# Patient Record
Sex: Female | Born: 1965 | Race: Black or African American | Hispanic: No | Marital: Married | Smoking: Never smoker
Health system: Southern US, Community
[De-identification: ages and names within clinical notes are randomized; demographics above are authoritative.]

## PROBLEM LIST (undated history)

## (undated) DIAGNOSIS — I10 Essential (primary) hypertension: Secondary | ICD-10-CM

## (undated) HISTORY — DX: Essential (primary) hypertension: I10

## (undated) HISTORY — PX: TUBAL LIGATION: SHX77

---

## 2011-04-01 ENCOUNTER — Other Ambulatory Visit (HOSPITAL_COMMUNITY): Payer: Self-pay | Admitting: Ophthalmology

## 2011-04-01 DIAGNOSIS — Z1231 Encounter for screening mammogram for malignant neoplasm of breast: Secondary | ICD-10-CM

## 2011-04-23 ENCOUNTER — Ambulatory Visit (HOSPITAL_COMMUNITY)
Admission: RE | Admit: 2011-04-23 | Discharge: 2011-04-23 | Disposition: A | Discharge status: Home / Self Care | Payer: Self-pay | Source: Ambulatory Visit | Attending: Ophthalmology | Admitting: Ophthalmology

## 2011-04-23 DIAGNOSIS — Z1231 Encounter for screening mammogram for malignant neoplasm of breast: Secondary | ICD-10-CM

## 2014-09-23 DIAGNOSIS — E785 Hyperlipidemia, unspecified: Secondary | ICD-10-CM | POA: Insufficient documentation

## 2014-09-23 DIAGNOSIS — I10 Essential (primary) hypertension: Secondary | ICD-10-CM | POA: Insufficient documentation

## 2014-09-23 DIAGNOSIS — K219 Gastro-esophageal reflux disease without esophagitis: Secondary | ICD-10-CM | POA: Insufficient documentation

## 2016-10-21 DIAGNOSIS — G4733 Obstructive sleep apnea (adult) (pediatric): Secondary | ICD-10-CM | POA: Insufficient documentation

## 2019-11-30 ENCOUNTER — Other Ambulatory Visit: Payer: Self-pay

## 2019-11-30 DIAGNOSIS — N644 Mastodynia: Secondary | ICD-10-CM

## 2020-01-03 ENCOUNTER — Ambulatory Visit: Payer: Self-pay

## 2020-01-03 ENCOUNTER — Ambulatory Visit
Admission: RE | Admit: 2020-01-03 | Discharge: 2020-01-03 | Disposition: A | Discharge status: Home / Self Care | Payer: No Typology Code available for payment source | Source: Ambulatory Visit | Attending: Obstetrics and Gynecology | Admitting: Obstetrics and Gynecology

## 2020-01-03 ENCOUNTER — Other Ambulatory Visit: Payer: Self-pay

## 2020-01-03 ENCOUNTER — Ambulatory Visit: Payer: Self-pay | Admitting: *Deleted

## 2020-01-03 VITALS — BP 170/102 | Temp 97.3°F | Wt 234.8 lb

## 2020-01-03 DIAGNOSIS — Z01419 Encounter for gynecological examination (general) (routine) without abnormal findings: Secondary | ICD-10-CM

## 2020-01-03 DIAGNOSIS — N644 Mastodynia: Secondary | ICD-10-CM

## 2020-01-04 LAB — CYTOLOGY - PAP
Comment: NEGATIVE
Diagnosis: NEGATIVE
High risk HPV: NEGATIVE

## 2020-01-04 NOTE — Patient Instructions (Signed)
Explained breast self awareness with Sonya Clark. Let patient know [facility]CP will cover Pap smears and HPV typing every 5 years unless has a history of abnormal Pap smears. Referred patient to the [facility] for a diagnostic mammogram. Appointment scheduled Thursday, January 03, 2020 at 1520. Patient aware of appointment and will be there. Let patient know will follow up with her within the next couple weeks with results of Pap smear by letter or phone. Sonya Clark verbalized understanding.  Sallyann Kinnaird, Kathaleen Maser, RN 12:00 AM

## 2020-01-04 NOTE — Progress Notes (Signed)
Sonya Clark is a 54 y.o. No obstetric history on file. female who presents to [facility] clinic today with complaint of bilateral outer breast pain x 3-4 months that comes and goes. Patient rates the pain at a 5 out of 10.    Pap Smear: Pap smear completed today. Last Pap smear was in 2013 at a clinic in [facility] and was normal per patient. Per patient has no history of an abnormal Pap smear. Last Pap smear result is not available in Epic.   Physical exam: Breasts Breasts symmetrical. No skin abnormalities bilateral breasts. No nipple retraction bilateral breasts. No nipple discharge bilateral breasts. No lymphadenopathy. No lumps palpated bilateral breasts. Complaints of bilateral outer and inner breast pain on exam.      Pelvic/Bimanual Ext Genitalia No lesions, no swelling and no discharge observed on external genitalia.        Vagina Vagina pink and normal texture. No lesions or discharge observed in vagina.        Cervix Cervix is present. Cervix pink and of normal texture. No discharge observed.    Uterus Uterus is present and palpable. Uterus in normal position and normal size.        Adnexae Bilateral ovaries present and palpable. No tenderness on palpation.         Rectovaginal No rectal exam completed today since patient had no rectal complaints. No skin abnormalities observed on exam.     Smoking History: Patient has never smoked.   Patient Navigation: Patient education provided. Access to services provided for patient through [facility] program.   Colorectal Cancer Screening: Per patient had a colonoscopy completed in 2013. No complaints today.    Breast and Cervical Cancer Risk Assessment: Patient has family history of a maternal aunt having breast cancer. Patient has no known genetic mutations or history of radiation treatment to the chest before age 62. Patient does not have history of cervical dysplasia, immunocompromised, or DES exposure in-utero.  Risk Assessment     Risk Scores      01/03/2020   Last edited by: Narda Rutherford, LPN   5-year risk: 1 %   Lifetime risk: 6.2 %          A: BCCCP exam with pap smear Complaint of bilateral outer breast pain.  P: Referred patient to the [facility] for a diagnostic mammogram. Appointment scheduled Thursday, January 03, 2020 at 1520.  Priscille Heidelberg, RN 01/04/2020 12:00 AM

## 2020-01-07 ENCOUNTER — Telehealth: Payer: Self-pay

## 2020-01-07 NOTE — Telephone Encounter (Signed)
Normal Pap/HPV result letter mailed to patient.  

## 2020-02-17 ENCOUNTER — Emergency Department (HOSPITAL_COMMUNITY): Payer: HRSA Program

## 2020-02-17 ENCOUNTER — Other Ambulatory Visit: Payer: Self-pay

## 2020-02-17 ENCOUNTER — Encounter (HOSPITAL_COMMUNITY): Payer: Self-pay | Admitting: Emergency Medicine

## 2020-02-17 ENCOUNTER — Inpatient Hospital Stay (HOSPITAL_COMMUNITY)
Admission: EM | Admit: 2020-02-17 | Discharge: 2020-02-22 | DRG: 177 | Disposition: A | Discharge status: Home / Self Care | Payer: HRSA Program | Attending: Student in an Organized Health Care Education/Training Program | Admitting: Student in an Organized Health Care Education/Training Program

## 2020-02-17 DIAGNOSIS — Z6839 Body mass index (BMI) 39.0-39.9, adult: Secondary | ICD-10-CM | POA: Diagnosis not present

## 2020-02-17 DIAGNOSIS — Z833 Family history of diabetes mellitus: Secondary | ICD-10-CM | POA: Diagnosis not present

## 2020-02-17 DIAGNOSIS — E876 Hypokalemia: Secondary | ICD-10-CM | POA: Diagnosis present

## 2020-02-17 DIAGNOSIS — E1165 Type 2 diabetes mellitus with hyperglycemia: Secondary | ICD-10-CM | POA: Diagnosis present

## 2020-02-17 DIAGNOSIS — J9601 Acute respiratory failure with hypoxia: Secondary | ICD-10-CM | POA: Diagnosis present

## 2020-02-17 DIAGNOSIS — U071 COVID-19: Principal | ICD-10-CM

## 2020-02-17 DIAGNOSIS — E785 Hyperlipidemia, unspecified: Secondary | ICD-10-CM

## 2020-02-17 DIAGNOSIS — Z8249 Family history of ischemic heart disease and other diseases of the circulatory system: Secondary | ICD-10-CM | POA: Diagnosis not present

## 2020-02-17 DIAGNOSIS — I10 Essential (primary) hypertension: Secondary | ICD-10-CM | POA: Diagnosis present

## 2020-02-17 DIAGNOSIS — Z79899 Other long term (current) drug therapy: Secondary | ICD-10-CM | POA: Diagnosis not present

## 2020-02-17 DIAGNOSIS — R0602 Shortness of breath: Secondary | ICD-10-CM | POA: Diagnosis present

## 2020-02-17 DIAGNOSIS — R0902 Hypoxemia: Secondary | ICD-10-CM

## 2020-02-17 LAB — URINALYSIS, ROUTINE W REFLEX MICROSCOPIC
Bacteria, UA: NONE SEEN
Bilirubin Urine: NEGATIVE
Glucose, UA: NEGATIVE mg/dL
Ketones, ur: NEGATIVE mg/dL
Leukocytes,Ua: NEGATIVE
Nitrite: NEGATIVE
Protein, ur: >=300 mg/dL — AB
Specific Gravity, Urine: 1.018 (ref 1.005–1.030)
pH: 6 (ref 5.0–8.0)

## 2020-02-17 LAB — CBC
HCT: 42.5 % (ref 36.0–46.0)
Hemoglobin: 12.6 g/dL (ref 12.0–15.0)
MCH: 23.1 pg — ABNORMAL LOW (ref 26.0–34.0)
MCHC: 29.6 g/dL — ABNORMAL LOW (ref 30.0–36.0)
MCV: 78 fL — ABNORMAL LOW (ref 80.0–100.0)
Platelets: 455 10*3/uL — ABNORMAL HIGH (ref 150–400)
RBC: 5.45 MIL/uL — ABNORMAL HIGH (ref 3.87–5.11)
RDW: 17.1 % — ABNORMAL HIGH (ref 11.5–15.5)
WBC: 11 10*3/uL — ABNORMAL HIGH (ref 4.0–10.5)
nRBC: 0 % (ref 0.0–0.2)

## 2020-02-17 LAB — BASIC METABOLIC PANEL
Anion gap: 10 (ref 5–15)
BUN: 6 mg/dL (ref 6–20)
CO2: 26 mmol/L (ref 22–32)
Calcium: 8.6 mg/dL — ABNORMAL LOW (ref 8.9–10.3)
Chloride: 104 mmol/L (ref 98–111)
Creatinine, Ser: 0.83 mg/dL (ref 0.44–1.00)
GFR calc Af Amer: >60 mL/min (ref >60)
GFR calc non Af Amer: >60 mL/min (ref >60)
Glucose, Bld: 168 mg/dL — ABNORMAL HIGH (ref 70–99)
Potassium: 3.3 mmol/L — ABNORMAL LOW (ref 3.5–5.1)
Sodium: 140 mmol/L (ref 135–145)

## 2020-02-17 LAB — HIV ANTIBODY (ROUTINE TESTING W REFLEX): HIV Screen 4th Generation wRfx: NONREACTIVE

## 2020-02-17 LAB — LACTATE DEHYDROGENASE: LDH: 725 U/L — ABNORMAL HIGH (ref 98–192)

## 2020-02-17 LAB — D-DIMER, QUANTITATIVE: D-Dimer, Quant: 9.73 ug/mL-FEU — ABNORMAL HIGH (ref 0.00–0.50)

## 2020-02-17 LAB — SARS CORONAVIRUS 2 BY RT PCR (HOSPITAL ORDER, PERFORMED IN ~~LOC~~ HOSPITAL LAB): SARS Coronavirus 2: POSITIVE — AB

## 2020-02-17 LAB — FIBRINOGEN: Fibrinogen: 641 mg/dL — ABNORMAL HIGH (ref 210–475)

## 2020-02-17 LAB — TROPONIN I (HIGH SENSITIVITY): Troponin I (High Sensitivity): 12 ng/L (ref <18)

## 2020-02-17 LAB — PROCALCITONIN: Procalcitonin: <0.1 ng/mL

## 2020-02-17 LAB — FERRITIN: Ferritin: 101 ng/mL (ref 11–307)

## 2020-02-17 MED ORDER — ENOXAPARIN SODIUM 40 MG/0.4ML ~~LOC~~ SOLN
40.0000 mg | SUBCUTANEOUS | Status: DC
  Administered 2020-02-17: 40 mg via SUBCUTANEOUS
  Filled 2020-02-17: qty 0.4

## 2020-02-17 MED ORDER — ONDANSETRON HCL 4 MG PO TABS
4.0000 mg | ORAL_TABLET | Freq: Four times a day (QID) | ORAL | Status: DC | PRN
  Administered 2020-02-18 – 2020-02-21 (×4): 4 mg via ORAL
  Filled 2020-02-17 (×5): qty 1

## 2020-02-17 MED ORDER — SODIUM CHLORIDE 0.9% FLUSH
3.0000 mL | Freq: Two times a day (BID) | INTRAVENOUS | Status: DC
  Administered 2020-02-18 – 2020-02-22 (×9): 3 mL via INTRAVENOUS

## 2020-02-17 MED ORDER — SODIUM CHLORIDE 0.9 % IV SOLN
100.0000 mg | Freq: Every day | INTRAVENOUS | Status: AC
  Administered 2020-02-18 – 2020-02-21 (×4): 100 mg via INTRAVENOUS
  Filled 2020-02-17: qty 100
  Filled 2020-02-17 (×3): qty 20

## 2020-02-17 MED ORDER — LISINOPRIL 20 MG PO TABS
20.0000 mg | ORAL_TABLET | Freq: Every day | ORAL | Status: DC
  Administered 2020-02-17 – 2020-02-22 (×6): 20 mg via ORAL
  Filled 2020-02-17 (×6): qty 1

## 2020-02-17 MED ORDER — SENNOSIDES-DOCUSATE SODIUM 8.6-50 MG PO TABS: 1.0000 | ORAL_TABLET | Freq: Every evening | ORAL | Status: DC | PRN

## 2020-02-17 MED ORDER — ONDANSETRON HCL 4 MG/2ML IJ SOLN
4.0000 mg | Freq: Four times a day (QID) | INTRAMUSCULAR | Status: DC | PRN
  Administered 2020-02-19 – 2020-02-20 (×2): 4 mg via INTRAVENOUS
  Filled 2020-02-17 (×2): qty 2

## 2020-02-17 MED ORDER — METHYLPREDNISOLONE SODIUM SUCC 125 MG IJ SOLR
100.0000 mg | Freq: Two times a day (BID) | INTRAMUSCULAR | Status: AC
  Administered 2020-02-17 – 2020-02-20 (×6): 100 mg via INTRAVENOUS
  Filled 2020-02-17 (×6): qty 2

## 2020-02-17 MED ORDER — POTASSIUM CHLORIDE CRYS ER 20 MEQ PO TBCR
40.0000 meq | EXTENDED_RELEASE_TABLET | Freq: Once | ORAL | Status: AC
  Administered 2020-02-17: 40 meq via ORAL
  Filled 2020-02-17: qty 2

## 2020-02-17 MED ORDER — GUAIFENESIN-DM 100-10 MG/5ML PO SYRP
10.0000 mL | ORAL_SOLUTION | ORAL | Status: DC | PRN
  Administered 2020-02-18 – 2020-02-21 (×4): 10 mL via ORAL
  Filled 2020-02-17 (×4): qty 10

## 2020-02-17 MED ORDER — PREDNISONE 20 MG PO TABS: 50.0000 mg | ORAL_TABLET | Freq: Every day | ORAL | Status: DC

## 2020-02-17 MED ORDER — SODIUM CHLORIDE 0.9 % IV SOLN
200.0000 mg | Freq: Once | INTRAVENOUS | Status: AC
  Administered 2020-02-17: 200 mg via INTRAVENOUS
  Filled 2020-02-17: qty 40

## 2020-02-17 MED ORDER — HYDROCHLOROTHIAZIDE 25 MG PO TABS
25.0000 mg | ORAL_TABLET | Freq: Every day | ORAL | Status: DC
  Administered 2020-02-17 – 2020-02-22 (×6): 25 mg via ORAL
  Filled 2020-02-17 (×6): qty 1

## 2020-02-17 MED ORDER — HYDROCOD POLST-CPM POLST ER 10-8 MG/5ML PO SUER
5.0000 mL | Freq: Two times a day (BID) | ORAL | Status: DC | PRN
  Administered 2020-02-19 – 2020-02-21 (×4): 5 mL via ORAL
  Filled 2020-02-17 (×4): qty 5

## 2020-02-17 MED ORDER — ACETAMINOPHEN 325 MG PO TABS
650.0000 mg | ORAL_TABLET | Freq: Four times a day (QID) | ORAL | Status: DC | PRN
  Administered 2020-02-18 – 2020-02-22 (×8): 650 mg via ORAL
  Filled 2020-02-17 (×8): qty 2

## 2020-02-17 NOTE — ED Notes (Signed)
Sonya Clark daughter [phone] would like an update on the pt

## 2020-02-17 NOTE — H&P (Signed)
Date: 02/17/2020               Patient Name:  Sonya Clark MRN: [id number]  DOB: [birth year] Age / Sex: 54 y.o., female   PCP: Patient, No Pcp Per         Medical Service: Internal Medicine Teaching Service         Attending Physician: Dr. Gust Rung, DO    First Contact: Dr. Laddie Aquas Pager: [phone]  Second Contact: Dr. Maryla Morrow Pager: [phone]       After Hours (After 5p/  First Contact Pager: [phone]  weekends / holidays): Second Contact Pager: [phone]   Chief Complaint: Shortness of Breath  History of Present Illness:   Ms. Sonya Clark is a 54 y.o. morbidly obese F w/ PMHx HTN who presented to the ED with about 11 days of worsening shortness of breath. She states that she had received her first Pfeizer dose about 2 weeks ago, and 3 days later, developed a severe, stabbing, diffuse headache and nausea/diarrhea. Her headache, nausea, and diarrhea resolved, but she began to develop shortness of breath and productive cough, which have gradually worsened. She endorses pleuritic CP currently that does not radiate. She states her sputum initially had small amounts of blood in it, but is now yellow in color. She endorses mild, right-sided abdominal pain that is achy in nature, which she attributes to spending too much time in bed. She denies any known exposures to COVID-19. She denies any fevers, chills, vomiting, swelling, CP at rest, difficulty urinating, or changes in bowel habits. She states she is supposed to get her second COVID-19 shot October 15th.  Home Meds: Current Meds  Medication Sig  . acetaminophen (TYLENOL) 500 MG tablet Take 500 mg by mouth every 6 (six) hours as needed for mild pain.  Marland Kitchen lisinopril-hydrochlorothiazide (ZESTORETIC) 20-25 MG tablet Take 1 tablet by mouth daily.   Allergies: Allergies as of 02/17/2020  . (No Known Allergies)   Past Medical History:  Diagnosis Date  . Hypertension     Family History:  Family History  Problem Relation Age of Onset  .  Lung cancer Mother   . Hypertension Father   . Diabetes Sister   . Diabetes Brother   . Stroke Brother   . Breast cancer Maternal Aunt    Social History: Patient states she lives at home with her boyfriend and has a good social support network at home. She does not have any significant tobacco use history, drinks alcohol very rarely, and denies any other drug use. Denies any known sick contacts.   Review of Systems: A complete ROS was negative except as per HPI.   Physical Exam: Blood pressure (!) 165/80, pulse 95, temperature 99.3 F (37.4 C), temperature source Oral, resp. rate (!) 22, weight 102.5 kg, SpO2 97 %.  General: Patient is morbidly obese, in mild acute distress.  Eyes: Sclera non-icteric. No conjunctival injection.  HENT: No nasal discharge. Head atraumatic, normocephalic. Respiratory: Lungs are CTA, bilaterally. No wheezes, rales, or rhonchi. Patient is tachypneic. Cardiovascular: Regular rate and rhythm. No murmurs, rubs, or gallops. No lower extremity edema. Distal pulses 2+ in bilateral lower extremities.  Neurological: Patient is alert and oriented x 3. Sensation is grossly intact.  MSK: Normal muscle bulk and tone. ROMI in all four extremities.  Abdominal: Soft with minimal R-sided abdominal pain, negative Murphy's sign. No guarding or rebound. No distention. Bowel sounds intact.  Skin: No lesions. No rashes.  Psych: Normal affect. Normal tone  of voice.   EKG: personally reviewed my interpretation is NSR at a rate of 94bpm. Normal PR and QTc intervals. No ST depressions or elevations. No acute concern for ischemia. No priors for comparison.   CXR: personally reviewed my interpretation is mild vascular congestion at the bases bilaterally with possible very small L-sided pleural effusion. Possible ground glass opacities in the RUL.   [name] is a 54 y.o. F w/ PMHx HTN presenting with 11 days of worsening SOB/cough, found to be positive for COVID-19.  Assessment &  Plan by Problem: Active Problems:   COVID-19 virus infection  # Active COVID-19 Infection with Hypoxia Morbidly obese female presents with 11 days of worsening SOB / productive cough / pleuritic CP with resolved nausea, HA and diarrhea, found to be COVID-19 positive. She denies any known exposures and does not smoke tobacco products. Patient is tachypneic, satting 80's-90's on 2-4L in ED, requiring O2. Troponin 12, LDH 725, WBC 11.0, D-Dimer 9.73, Fibrinogen 641.  - Started methylprednisolone 100mg  IV BID x 3 days - Will follow with prednisone 50mg  daily - Will start on remdesivir 100mg  IVPB - Will trend CRP, D-dimer, Ferritin - Follow CBC, CMP, Mg, Phos - Check repeat troponin - check Procalcitonin  - Acetaminophen 650mg  QID PRN for pain / fever - Robitussin / Tussionex PRN for cough  - Encourage Incentive Spirometry   # Uncontrolled HTN BP elevated on admission in the 140's to 160's. Unclear outpatient medical compliance on Zestoretic 20-25mg  at home.  - Started hydrochlorothiazide 25mg  daily - Started lisinopril 20mg  daily  - Continue to monitor on these home medications  # Macroalbuminuria U/A showed macroalbuminuria. Patient denies any history of DM although sugars elevated here. Patient also has uncontrolled HTN, which may be contributing.  - Continue lisinopril 20mg  daily   # Hypokalemia Potassium 3.3.  - Will give KCl PO and monitor morning CMP  Code Status: Full code DVT PPx: Lovenox Diet: Heart Healthy Diet  IVF: None   Dispo: Admit patient to Inpatient with expected length of stay greater than 2 midnights.  Signed: , MD 02/17/2020, 7:16 PM  Pager: [phone] After 5pm on weekdays and 1pm on weekends: On Call pager: [phone]

## 2020-02-17 NOTE — ED Notes (Signed)
Pt SpO2 88% on room air. Pt placed on 2L via [place] and SpO2 is now 94% on 2L.

## 2020-02-17 NOTE — ED Triage Notes (Signed)
C/o SOB, diarrhea, headache, and cough x 3 days.   No known COVID contacts.  Received 1 dose of Pfizer vaccine 3 weeks ago.

## 2020-02-17 NOTE — ED Provider Notes (Signed)
[facility] EMERGENCY DEPARTMENT Provider Note   CSN: [id number] Arrival date & time: 02/17/20  1761     History Chief Complaint  Patient presents with  . Shortness of Breath    Sonya Clark is a 54 y.o. female.  HPI She has been ill for a week with shortness of breath and cough.  She had her first Covid vaccine, #1 of 2, about 2 weeks ago.  Her shortness of breath is worsening.  She is not a smoker.  No other recent illnesses.  She denies nausea, vomiting, change in bowel or urinary habits.  There are no other known modifying factors.    Past Medical History:  Diagnosis Date  . Hypertension     Patient Active Problem List   Diagnosis Date Noted  . OSA (obstructive sleep apnea) 10/21/2016  . Gastroesophageal reflux disease without esophagitis 09/23/2014  . HTN (hypertension), benign 09/23/2014  . Hyperlipidemia 09/23/2014    Past Surgical History:  Procedure Laterality Date  . TUBAL LIGATION       OB History   No obstetric history on file.     Family History  Problem Relation Age of Onset  . Lung cancer Mother   . Hypertension Father   . Diabetes Sister   . Diabetes Brother   . Stroke Brother   . Breast cancer Maternal Aunt     Social History   Tobacco Use  . Smoking status: Never Smoker  . Smokeless tobacco: Never Used  Vaping Use  . Vaping Use: Never used  Substance Use Topics  . Alcohol use: Yes    Comment: occasional  . Drug use: Never    Home Medications Prior to Admission medications   Medication Sig Start Date End Date Taking? Authorizing Provider  lisinopril-hydrochlorothiazide (ZESTORETIC) 20-25 MG tablet Take 1 tablet by mouth daily.    [provider]    Allergies    Patient has no allergy information on record.  Review of Systems   Review of Systems  All other systems reviewed and are negative.   Physical Exam Updated Vital Signs BP (!) 152/92 (BP Location: Right Arm)   Pulse 94   Temp 99.3 F  (37.4 C) (Oral)   Resp (!) 22   SpO2 (!) 89%   Physical Exam Vitals and nursing note reviewed.  Constitutional:      Appearance: She is well-developed.  HENT:     Head: Normocephalic and atraumatic.     Right Ear: External ear normal.     Left Ear: External ear normal.  Eyes:     Conjunctiva/sclera: Conjunctivae normal.     Pupils: Pupils are equal, round, and reactive to light.  Neck:     Trachea: Phonation normal.  Cardiovascular:     Rate and Rhythm: Tachycardia present.  Pulmonary:     Effort: Pulmonary effort is normal.     Breath sounds: Normal breath sounds.  Abdominal:     General: There is no distension.     Palpations: Abdomen is soft.     Tenderness: There is no abdominal tenderness.  Musculoskeletal:        General: Normal range of motion.     Cervical back: Normal range of motion and neck supple.  Skin:    General: Skin is warm and dry.  Neurological:     Mental Status: She is alert and oriented to person, place, and time.     Cranial Nerves: No cranial nerve deficit.     Sensory:  No sensory deficit.     Motor: No abnormal muscle tone.     Coordination: Coordination normal.  Psychiatric:        Mood and Affect: Mood normal.        Behavior: Behavior normal.        Thought Content: Thought content normal.        Judgment: Judgment normal.     ED Results / Procedures / Treatments   Labs (all labs ordered are listed, but only abnormal results are displayed) Labs Reviewed  SARS CORONAVIRUS 2 BY RT PCR (HOSPITAL ORDER, PERFORMED IN [facility]) - Abnormal; Notable for the following components:      Result Value   SARS Coronavirus 2 POSITIVE (*)    All other components within normal limits  BASIC METABOLIC PANEL - Abnormal; Notable for the following components:   Potassium 3.3 (*)    Glucose, Bld 168 (*)    Calcium 8.6 (*)    All other components within normal limits  CBC - Abnormal; Notable for the following components:   WBC 11.0 (*)      RBC 5.45 (*)    MCV 78.0 (*)    MCH 23.1 (*)    MCHC 29.6 (*)    RDW 17.1 (*)    Platelets 455 (*)    All other components within normal limits  URINALYSIS, ROUTINE W REFLEX MICROSCOPIC  HIV ANTIBODY (ROUTINE TESTING W REFLEX)  D-DIMER, QUANTITATIVE (NOT AT [facility])  FERRITIN  FIBRINOGEN  LACTATE DEHYDROGENASE  PROCALCITONIN  CBG MONITORING, ED  TROPONIN I (HIGH SENSITIVITY)    EKG EKG Interpretation  Date/Time:  Sunday February 17 2020 08:49:01 EDT Ventricular Rate:  94 PR Interval:  142 QRS Duration: 80 QT Interval:  384 QTC Calculation: 480 R Axis:   44 Text Interpretation: Normal sinus rhythm Nonspecific ST and T wave abnormality Prolonged QT Abnormal ECG No old tracing to compare Confirmed by Mancel Bale [phone]) on 02/17/2020 3:07:12 PM   Radiology DG Chest Portable 1 View  Result Date: 02/17/2020 CLINICAL DATA:  Shortness of breath, diarrhea, headache, and cough for 3 days, received first dose of Pfizer vaccine 3 weeks ago EXAM: PORTABLE CHEST 1 VIEW COMPARISON:  Portable exam 0924 hours without priors for comparison FINDINGS: Upper normal heart size. Normal mediastinal contours and pulmonary vascularity. Questionable hazy RIGHT upper lobe infiltrate. Remaining lungs grossly clear. No pleural effusion or pneumothorax. IMPRESSION: Questionable RIGHT upper lobe pneumonia. Electronically Signed   By: Ulyses Southward M.D.   On: 02/17/2020 09:59    Procedures Procedures (including critical care time)  Medications Ordered in ED Medications  remdesivir 200 mg in sodium chloride 0.9% 250 mL IVPB (has no administration in time range)    Followed by  remdesivir 100 mg in sodium chloride 0.9 % 100 mL IVPB (has no administration in time range)  methylPREDNISolone sodium succinate (SOLU-MEDROL) injection 1 mg/kg (has no administration in time range)    Followed by  predniSONE (DELTASONE) tablet 50 mg (has no administration in time range)    ED Course  I have reviewed the  triage vital signs and the nursing notes.  Pertinent labs & imaging results that were available during my care of the patient were reviewed by me and considered in my medical decision making (see chart for details).  Clinical Course as of Feb 16 1526  Wynelle Link Feb 17, 2020  1505 Diffuse increased markings with possible consolidation right upper lobe.  This was interpreted by me.  DG  Chest Portable 1 View [EW]  1505 Normal except potassium low, glucose high  Basic metabolic panel(!) [EW]  1506 Normal except white count high, and abnormal RBC indices.  CBC(!) [EW]  1506 Abnormal, positive  SARS Coronavirus 2 by RT PCR (hospital order, performed in [facility] hospital lab) Nasopharyngeal Nasopharyngeal Swab(!) [EW]    Clinical Course User Index [EW] Mancel Bale, MD   MDM Rules/Calculators/A&P                           Patient Vitals for the past 24 hrs:  BP Temp Temp src Pulse Resp SpO2  02/17/20 1439 -- -- -- -- -- (!) 89 %  02/17/20 1438 (!) 152/92 99.3 F (37.4 C) Oral 94 (!) 22 (!) 84 %  02/17/20 1147 (!) 159/81 -- -- 91 (!) 22 94 %  02/17/20 0844 (!) 160/85 97.8 F (36.6 C) Oral 96 15 95 %    4:44 PM Reevaluation with update and discussion. After initial assessment and treatment, an updated evaluation reveals no change in clinical status, findings discussed with the patient and all questions were answered. Mancel Bale   Medical Decision Making:  This patient is presenting for evaluation of shortness of breath with cough, worsening, which does require a range of treatment options, and is a complaint that involves a high risk of morbidity and mortality. The differential diagnoses include pneumonia, viral infection, COVID-19 infection. I decided to review old records, and in summary previously healthy female presenting with signs and symptoms of COVID-19 infection.  I did not require additional historical information from anyone.  Clinical Laboratory Tests Ordered, included  CBC, Metabolic panel and Covid test, inflammatory markers. Review indicates mild white blood cell count elevation, abnormal RBC indices, elevated glucose, slightly low potassium, Covid test positive. Radiologic Tests Ordered, included chest x-ray.  I independently Visualized: Radiographic images, which show increased markings with possible right upper lobe consolidation     Critical Interventions-clinical evaluation, laboratory testing, chest x-ray, observation reassessment  After These Interventions, the Patient was reevaluated and was found with hypoxia, 84% on room air, improving to greater than 90 with nasal cannula oxygen at low dose.  Patient generally weak on exam, without focal abnormality.  She is overweight, and may benefit from treatment with monoclonal antibody.  Sonya Clark was evaluated in Emergency Department on 02/17/2020 for the symptoms described in the history of present illness. She was evaluated in the context of the global COVID-19 pandemic, which necessitated consideration that the patient might be at risk for infection with the SARS-CoV-2 virus that causes COVID-19. Institutional protocols and algorithms that pertain to the evaluation of patients at risk for COVID-19 are in a state of rapid change based on information released by regulatory bodies including the CDC and federal and state organizations. These policies and algorithms were followed during the patient's care in the ED.   CRITICAL CARE-yes Performed by: Mancel Bale  Nursing Notes Reviewed/ Care Coordinated Applicable Imaging Reviewed Interpretation of Laboratory Data incorporated into ED treatment   3:26 PM-Consult complete with teaching service resident. Patient case explained and discussed.  She agrees to admit patient for further evaluation and treatment. Call ended at 4:40 PM    Final Clinical Impression(s) / ED Diagnoses Final diagnoses:  COVID-19 virus infection  Hypoxia    Rx / DC Orders ED  Discharge Orders    None       Mancel Bale, MD 02/17/20 1644

## 2020-02-17 NOTE — Hospital Course (Signed)
Admitted 02/17/2020   Allergies: Patient has no allergy information on record. Pertinent Hx: HTN  54 y.o. female p/w cough, shortness of breath, diarrhea, headache x days  *COVID-19: Was tachycardiac and hypoxia at 88% on room air on arrival. Improved to 94% on 2 L on remdesivir and steroid.  She received 1 dose of Pfizer vaccine 3 weeks ago. CXR w questionable right upper lobe pneumonia. Considering adding monoclonal Ab.  Troponin, procalcitonin, LDH, fibrinogen, ferritin, D-dimer are pending.  *Hypokalemia: 3.3 mild.  Replacing and checking mag.  *HTN: BP 150s/80s on arrival. Resumin home meds: Lisinopril-HCTZ 20-25 mg daily  Consults: None Meds: Remdesivir, prednisone okay VTE ppx: Lovenox IVF: None Diet: HH   Order:   HTN med Lovenox Monoclonal Ab? K cl

## 2020-02-18 DIAGNOSIS — U071 COVID-19: Principal | ICD-10-CM

## 2020-02-18 LAB — COMPREHENSIVE METABOLIC PANEL
ALT: 33 U/L (ref 0–44)
AST: 41 U/L (ref 15–41)
Albumin: 2.3 g/dL — ABNORMAL LOW (ref 3.5–5.0)
Alkaline Phosphatase: 80 U/L (ref 38–126)
Anion gap: 12 (ref 5–15)
BUN: 11 mg/dL (ref 6–20)
CO2: 22 mmol/L (ref 22–32)
Calcium: 8.6 mg/dL — ABNORMAL LOW (ref 8.9–10.3)
Chloride: 104 mmol/L (ref 98–111)
Creatinine, Ser: 0.83 mg/dL (ref 0.44–1.00)
GFR calc Af Amer: >60 mL/min (ref >60)
GFR calc non Af Amer: >60 mL/min (ref >60)
Glucose, Bld: 219 mg/dL — ABNORMAL HIGH (ref 70–99)
Potassium: 3.8 mmol/L (ref 3.5–5.1)
Sodium: 138 mmol/L (ref 135–145)
Total Bilirubin: 0.5 mg/dL (ref 0.3–1.2)
Total Protein: 7.4 g/dL (ref 6.5–8.1)

## 2020-02-18 LAB — MAGNESIUM: Magnesium: 2.1 mg/dL (ref 1.7–2.4)

## 2020-02-18 LAB — CBC WITH DIFFERENTIAL/PLATELET
Abs Immature Granulocytes: 0 10*3/uL (ref 0.00–0.07)
Basophils Absolute: 0 10*3/uL (ref 0.0–0.1)
Basophils Relative: 0 %
Eosinophils Absolute: 0 10*3/uL (ref 0.0–0.5)
Eosinophils Relative: 0 %
HCT: 41.6 % (ref 36.0–46.0)
Hemoglobin: 12.2 g/dL (ref 12.0–15.0)
Lymphocytes Relative: 2 %
Lymphs Abs: 0.2 10*3/uL — ABNORMAL LOW (ref 0.7–4.0)
MCH: 22.7 pg — ABNORMAL LOW (ref 26.0–34.0)
MCHC: 29.3 g/dL — ABNORMAL LOW (ref 30.0–36.0)
MCV: 77.5 fL — ABNORMAL LOW (ref 80.0–100.0)
Monocytes Absolute: 0.1 10*3/uL (ref 0.1–1.0)
Monocytes Relative: 1 %
Neutro Abs: 8.5 10*3/uL — ABNORMAL HIGH (ref 1.7–7.7)
Neutrophils Relative %: 97 %
Platelets: 477 10*3/uL — ABNORMAL HIGH (ref 150–400)
RBC: 5.37 MIL/uL — ABNORMAL HIGH (ref 3.87–5.11)
RDW: 17 % — ABNORMAL HIGH (ref 11.5–15.5)
WBC: 8.8 10*3/uL (ref 4.0–10.5)
nRBC: 0 /100 WBC
nRBC: 0.2 % (ref 0.0–0.2)

## 2020-02-18 LAB — GLUCOSE, CAPILLARY
Glucose-Capillary: 243 mg/dL — ABNORMAL HIGH (ref 70–99)
Glucose-Capillary: 238 mg/dL — ABNORMAL HIGH (ref 70–99)
Glucose-Capillary: 226 mg/dL — ABNORMAL HIGH (ref 70–99)

## 2020-02-18 LAB — C-REACTIVE PROTEIN: CRP: 21.3 mg/dL — ABNORMAL HIGH (ref <1.0)

## 2020-02-18 LAB — D-DIMER, QUANTITATIVE: D-Dimer, Quant: 6.4 ug/mL-FEU — ABNORMAL HIGH (ref 0.00–0.50)

## 2020-02-18 LAB — TROPONIN I (HIGH SENSITIVITY): Troponin I (High Sensitivity): 12 ng/L (ref <18)

## 2020-02-18 LAB — PHOSPHORUS: Phosphorus: 3.1 mg/dL (ref 2.5–4.6)

## 2020-02-18 LAB — FERRITIN: Ferritin: 100 ng/mL (ref 11–307)

## 2020-02-18 MED ORDER — ENOXAPARIN SODIUM 60 MG/0.6ML ~~LOC~~ SOLN
50.0000 mg | SUBCUTANEOUS | Status: DC
  Administered 2020-02-18 – 2020-02-21 (×4): 50 mg via SUBCUTANEOUS
  Filled 2020-02-18 (×4): qty 0.6

## 2020-02-18 MED ORDER — INSULIN ASPART 100 UNIT/ML ~~LOC~~ SOLN
0.0000 [IU] | Freq: Three times a day (TID) | SUBCUTANEOUS | Status: DC
  Administered 2020-02-18 – 2020-02-19 (×2): 5 [IU] via SUBCUTANEOUS
  Administered 2020-02-19 (×2): 3 [IU] via SUBCUTANEOUS
  Administered 2020-02-20: 7 [IU] via SUBCUTANEOUS
  Administered 2020-02-20: 3 [IU] via SUBCUTANEOUS
  Administered 2020-02-20: 9 [IU] via SUBCUTANEOUS
  Administered 2020-02-21: 3 [IU] via SUBCUTANEOUS
  Administered 2020-02-22: 2 [IU] via SUBCUTANEOUS
  Administered 2020-02-22: 5 [IU] via SUBCUTANEOUS

## 2020-02-18 MED ORDER — LIP MEDEX EX OINT
1.0000 "application " | TOPICAL_OINTMENT | CUTANEOUS | Status: DC | PRN
  Filled 2020-02-18: qty 7

## 2020-02-18 MED ORDER — CALCIUM CARBONATE ANTACID 500 MG PO CHEW
2.0000 | CHEWABLE_TABLET | Freq: Two times a day (BID) | ORAL | Status: DC | PRN
  Administered 2020-02-19: 400 mg via ORAL
  Administered 2020-02-21: 800 mg via ORAL
  Filled 2020-02-18 (×3): qty 4

## 2020-02-18 MED ORDER — BARICITINIB 2 MG PO TABS
4.0000 mg | ORAL_TABLET | Freq: Every day | ORAL | Status: DC
  Administered 2020-02-18 – 2020-02-22 (×5): 4 mg via ORAL
  Filled 2020-02-18 (×5): qty 2

## 2020-02-18 NOTE — ED Notes (Signed)
Attempted report to inpatient unit. RN number given for return call.

## 2020-02-18 NOTE — Progress Notes (Addendum)
° °  Subjective:   She is having reflux symptoms. She has not had much of an appetite. She feels short of breath and slight head ache. She has had cough with yellow phlegm with a little bit of blood coming up. Denies abdominal pain.  Objective:  Vital signs in last 24 hours: Vitals:   02/18/20 0200 02/18/20 0230 02/18/20 0345 02/18/20 0600  BP: 129/64 127/60  125/67  Pulse:      Resp: (!) 30 (!) 26  (!) 25  Temp:      TempSrc:      SpO2: 96% (!) 88% 95%   Weight:        Supplemental Oxygen: 95% on 4L [place]  Constitution: NAD, appears stated age Cardio: RRR, no m/r/g, no LE edema  Respiratory: CTA, no w/r/r Abdominal: NTTP, soft, non-distended MSK: moving all extremities Neuro: normal affect, a&ox3 Skin: c/d/i   Assessment/Plan:  Active Problems:   COVID-19 virus infection  54 year old female with known history of hypertension presenting to the ED with 11 days of worsening shortness of breath found to be positive for COVID-19.  COVID-19  Hospital day 2.  Oxygen requirements slightly increased, saturating 95% on 4 L [place].  Increased to 5 L.  CRP 21 and D-dimer 6.4 remain elevated.  Started on baricitinib today.  Day 2 of remdesivir and Solu-Medrol.  - tussionex q12h prn, robitussen q4h prn - remdesivir day 2/5 - decadron day 2 - baricitinib day 1 - IS, flutter valve - will need covid vaccine after discharge  Hyperglycemia - CBG 243  - SSI sensitive - Check Hgb A1c  HTN - Continue HCTZ 25 mg and lisinopril 20 mg   Hypokalemia - resolved, K 3.8 - trend CMP  VTE: Lovenox  IVF: None Diet: Heart Code: Full Code  Dispo: Anticipated discharge pending clinical improvement.  Korianna Washer N, DO 02/18/2020, 8:17 AM Pager: [phone] After 5pm on weekdays and 1pm on weekends: On Call Pager: [phone]

## 2020-02-18 NOTE — ED Notes (Addendum)
Additional attempt made to report to inpatient floor. RN unable to receive report at this time due to patient emergency. Floor will contact ED RN as soon as possible.

## 2020-02-18 NOTE — Progress Notes (Signed)
BMI = 39, ok to increase her DVT prophylaxis lovenox to 0.5mg /kg/day per Dr Cleaster Corin.  Change Lovenox to 50mg  SQ qday  , PharmD, BCIDP, AAHIVP, CPP Infectious Disease Pharmacist 02/18/2020 11:30 AM

## 2020-02-18 NOTE — ED Notes (Signed)
Pts 02 sat=86% while sleeping, 02 increased to 4LNC, 02 sat=90% on 4LNC.

## 2020-02-19 LAB — COMPREHENSIVE METABOLIC PANEL
ALT: 29 U/L (ref 0–44)
AST: 25 U/L (ref 15–41)
Albumin: 2.3 g/dL — ABNORMAL LOW (ref 3.5–5.0)
Alkaline Phosphatase: 77 U/L (ref 38–126)
Anion gap: 10 (ref 5–15)
BUN: 21 mg/dL — ABNORMAL HIGH (ref 6–20)
CO2: 25 mmol/L (ref 22–32)
Calcium: 8.9 mg/dL (ref 8.9–10.3)
Chloride: 102 mmol/L (ref 98–111)
Creatinine, Ser: 0.97 mg/dL (ref 0.44–1.00)
GFR calc Af Amer: >60 mL/min (ref >60)
GFR calc non Af Amer: >60 mL/min (ref >60)
Glucose, Bld: 255 mg/dL — ABNORMAL HIGH (ref 70–99)
Potassium: 3.6 mmol/L (ref 3.5–5.1)
Sodium: 137 mmol/L (ref 135–145)
Total Bilirubin: 0.4 mg/dL (ref 0.3–1.2)
Total Protein: 7.3 g/dL (ref 6.5–8.1)

## 2020-02-19 LAB — GLUCOSE, CAPILLARY
Glucose-Capillary: 247 mg/dL — ABNORMAL HIGH (ref 70–99)
Glucose-Capillary: 299 mg/dL — ABNORMAL HIGH (ref 70–99)
Glucose-Capillary: 221 mg/dL — ABNORMAL HIGH (ref 70–99)
Glucose-Capillary: 243 mg/dL — ABNORMAL HIGH (ref 70–99)

## 2020-02-19 LAB — CBC WITH DIFFERENTIAL/PLATELET
Abs Immature Granulocytes: 0.14 10*3/uL — ABNORMAL HIGH (ref 0.00–0.07)
Basophils Absolute: 0 10*3/uL (ref 0.0–0.1)
Basophils Relative: 0 %
Eosinophils Absolute: 0 10*3/uL (ref 0.0–0.5)
Eosinophils Relative: 0 %
HCT: 39.3 % (ref 36.0–46.0)
Hemoglobin: 12.2 g/dL (ref 12.0–15.0)
Immature Granulocytes: 1 %
Lymphocytes Relative: 11 %
Lymphs Abs: 1.4 10*3/uL (ref 0.7–4.0)
MCH: 23.8 pg — ABNORMAL LOW (ref 26.0–34.0)
MCHC: 31 g/dL (ref 30.0–36.0)
MCV: 76.6 fL — ABNORMAL LOW (ref 80.0–100.0)
Monocytes Absolute: 0.4 10*3/uL (ref 0.1–1.0)
Monocytes Relative: 3 %
Neutro Abs: 10.8 10*3/uL — ABNORMAL HIGH (ref 1.7–7.7)
Neutrophils Relative %: 85 %
Platelets: 641 10*3/uL — ABNORMAL HIGH (ref 150–400)
RBC: 5.13 MIL/uL — ABNORMAL HIGH (ref 3.87–5.11)
RDW: 16.7 % — ABNORMAL HIGH (ref 11.5–15.5)
WBC: 12.7 10*3/uL — ABNORMAL HIGH (ref 4.0–10.5)
nRBC: 0.2 % (ref 0.0–0.2)

## 2020-02-19 LAB — MAGNESIUM: Magnesium: 2.4 mg/dL (ref 1.7–2.4)

## 2020-02-19 LAB — D-DIMER, QUANTITATIVE: D-Dimer, Quant: 2.11 ug/mL-FEU — ABNORMAL HIGH (ref 0.00–0.50)

## 2020-02-19 LAB — IRON AND TIBC
Iron: 37 ug/dL (ref 28–170)
Saturation Ratios: 26 % (ref 10.4–31.8)
TIBC: 140 ug/dL — ABNORMAL LOW (ref 250–450)
UIBC: 103 ug/dL

## 2020-02-19 LAB — HEMOGLOBIN A1C
Hgb A1c MFr Bld: 7.9 % — ABNORMAL HIGH (ref 4.8–5.6)
Mean Plasma Glucose: 180.03 mg/dL

## 2020-02-19 LAB — FERRITIN: Ferritin: 124 ng/mL (ref 11–307)

## 2020-02-19 LAB — C-REACTIVE PROTEIN: CRP: 12.5 mg/dL — ABNORMAL HIGH (ref <1.0)

## 2020-02-19 LAB — PHOSPHORUS: Phosphorus: 4.3 mg/dL (ref 2.5–4.6)

## 2020-02-19 MED ORDER — INSULIN DETEMIR 100 UNIT/ML ~~LOC~~ SOLN
10.0000 [IU] | Freq: Two times a day (BID) | SUBCUTANEOUS | Status: DC
  Filled 2020-02-19: qty 0.1

## 2020-02-19 MED ORDER — INSULIN DETEMIR 100 UNIT/ML ~~LOC~~ SOLN
10.0000 [IU] | Freq: Every day | SUBCUTANEOUS | Status: DC
  Administered 2020-02-19 – 2020-02-21 (×3): 10 [IU] via SUBCUTANEOUS
  Filled 2020-02-19 (×5): qty 0.1

## 2020-02-19 NOTE — Progress Notes (Addendum)
   Subjective:   Patient continues to feel short of breath and fatigued.  Continuing to have a cough.  Denies abdominal pain.  Objective:  Vital signs in last 24 hours: Vitals:   02/18/20 2033 02/18/20 2036 02/18/20 2344 02/19/20 0526  BP: (!) 144/64 (!) 144/64 (!) 145/66 133/70  Pulse: 79 79 72 70  Resp: 18 20 20 18   Temp: 98.2 F (36.8 C) 98.2 F (36.8 C) 99.3 F (37.4 C) 98.1 F (36.7 C)  TempSrc: Oral Oral Oral Oral  SpO2: 100% 100% 93% 91%  Weight:      Height:        Supplemental Oxygen: 91% on 7 L HFNC  Constitution: NAD, appears stated age Cardio: RRR, no m/r/g, no LE edema  Respiratory: CTA, no w/r/r Abdominal: NTTP, soft, non-distended MSK: moving all extremities Neuro: normal affect, a&ox3 Skin: c/d/i   Assessment/Plan:  Active Problems:   COVID-19 virus infection  54 year old female with known history of hypertension presenting to the ED with worsening shortness of breath found to be positive for COVID-19.  COVID-19  Hospital day 3.  Oxygen requirements slightly increased, saturating 91% on 7L [place].  CRP 21 and D-dimer 6.4 remain elevated.  Day 3 of remdesivir and Solu-Medrol. Day 2 baricitinib.  Inflammatory markers have trended downwards.  - tussionex q12h prn, robitussen q4h prn - remdesivir day 3/5 - solu-medrol day 3/5 - baricitinib day 2 - IS, flutter valve - will need covid vaccine after discharge  Hyperglycemia Blood glucoses remained elevated.  Hemoglobin A1c 7.9.  Patient started on Levemir 10 units twice daily.  May benefit from an oral agent at time of discharge.   - Levemir 10 units twice daily - SSI - Will need to follow up with PCP   VTE: Lovenox             IVF: None Diet: Heart Code: Full Code  Dispo: Anticipated discharge pending clinical improvement.  Keshon Markovitz N, DO 02/19/2020, 7:48 AM Pager: [phone] After 5pm on weekdays and 1pm on weekends: On Call Pager: [phone]

## 2020-02-19 NOTE — TOC Initial Note (Signed)
Transition of Care [facility]) - Initial/Assessment Note    Patient Details  Name: Sonya Clark MRN: [id number] Date of Birth: Feb 06, 1966  Transition of Care [facility]) CM/SW Contact:    Lockie Pares, RN Phone Number: 02/19/2020, 9:07 AM  Clinical Narrative:                 COVID day 2 on 4LPM oxygen. No PCP no insurance listed currently.  Will likely need oxygen post hospitalization. May need MATCH for medications, no employment listed. Referred to [facility] and COVID clinic for follow up. CM will be following for needs  Expected Discharge Plan: Home/Self Care Barriers to Discharge: Continued Medical Work up   Patient Goals and CMS Choice        Expected Discharge Plan and Services Expected Discharge Plan: Home/Self Care   Discharge Planning Services: CM Consult   Living arrangements for the past 2 months: Single Family Home                                      Prior Living Arrangements/Services Living arrangements for the past 2 months: Single Family Home Lives with:: Spouse Patient language and need for interpreter reviewed:: Yes        Need for Family Participation in Patient Care: Yes (Comment) Care giver support system in place?: Yes (comment)   Criminal Activity/Legal Involvement Pertinent to Current Situation/Hospitalization: No - Comment as needed  Activities of Daily Living Home Assistive Devices/Equipment: None ADL Screening (condition at time of admission) Patient's cognitive ability adequate to safely complete daily activities?: No Is the patient deaf or have difficulty hearing?: No Does the patient have difficulty seeing, even when wearing glasses/contacts?: No Does the patient have difficulty concentrating, remembering, or making decisions?: No Patient able to express need for assistance with ADLs?: No Does the patient have difficulty dressing or bathing?: No Independently performs ADLs?: No Communication: Independent Does the patient have difficulty  walking or climbing stairs?: No Weakness of Legs: None Weakness of Arms/Hands: None  Permission Sought/Granted                  Emotional Assessment       Orientation: : Oriented to Self, Oriented to Place, Oriented to Situation, Oriented to  Time Alcohol / Substance Use: Not Applicable Psych Involvement: No (comment)  Admission diagnosis:  Hypoxia [R09.02] COVID-19 virus infection [U07.1] Patient Active Problem List   Diagnosis Date Noted  . COVID-19 virus infection 02/17/2020  . OSA (obstructive sleep apnea) 10/21/2016  . Gastroesophageal reflux disease without esophagitis 09/23/2014  . HTN (hypertension), benign 09/23/2014  . Hyperlipidemia 09/23/2014   PCP:  Patient, No Pcp Per Pharmacy:   [id number] - [place], [place] - [address] AT [facility] [facility] [address] [place] [address] Phone: [phone] Fax: [phone]     Social Determinants of Health (SDOH) Interventions    Readmission Risk Interventions No flowsheet data found.

## 2020-02-19 NOTE — Progress Notes (Signed)
Inpatient Diabetes Program Recommendations  AACE/ADA: New Consensus Statement on Inpatient Glycemic Control (2015)  Target Ranges:  Prepandial:   less than 140 mg/dL      Peak postprandial:   less than 180 mg/dL (1-2 hours)      Critically ill patients:  140 - 180 mg/dL   Lab Results  Component Value Date   GLUCAP 299 (H) 02/19/2020   HGBA1C 7.9 (H) 02/19/2020    Review of Glycemic Control Results for Sonya Clark, Sonya Clark (MRN [id number]) as of 02/19/2020 12:39  Ref. Range 02/18/2020 17:58 02/18/2020 20:30 02/19/2020 07:56 02/19/2020 11:41  Glucose-Capillary Latest Ref Range: 70 - 99 mg/dL 092 (H) 330 (H) 076 (H) 299 (H)   Diabetes history: None- note A1C=7.9% Outpatient Diabetes medications:  None Current orders for Inpatient glycemic control:  Novolog sensitive tid with meals Prednisone 50 mg daily  Inpatient Diabetes Program Recommendations:    Note elevated A1C.  No previous history of DM noted.  Please add Levemir 10 units bid to current insulin regimen while on steroids.  Based on A1C, if this is new diagnosis of DM, patient may benefit from the addition of oral DM agent at d/c.    Thanks,  Beryl Meager, RN, BC-ADM Inpatient Diabetes Coordinator Pager [phone] (8a-5p)

## 2020-02-20 LAB — CBC WITH DIFFERENTIAL/PLATELET
Abs Immature Granulocytes: 0.11 10*3/uL — ABNORMAL HIGH (ref 0.00–0.07)
Basophils Absolute: 0 10*3/uL (ref 0.0–0.1)
Basophils Relative: 0 %
Eosinophils Absolute: 0 10*3/uL (ref 0.0–0.5)
Eosinophils Relative: 0 %
HCT: 41.2 % (ref 36.0–46.0)
Hemoglobin: 12.4 g/dL (ref 12.0–15.0)
Immature Granulocytes: 1 %
Lymphocytes Relative: 8 %
Lymphs Abs: 0.9 10*3/uL (ref 0.7–4.0)
MCH: 22.9 pg — ABNORMAL LOW (ref 26.0–34.0)
MCHC: 30.1 g/dL (ref 30.0–36.0)
MCV: 76 fL — ABNORMAL LOW (ref 80.0–100.0)
Monocytes Absolute: 0.3 10*3/uL (ref 0.1–1.0)
Monocytes Relative: 2 %
Neutro Abs: 10.4 10*3/uL — ABNORMAL HIGH (ref 1.7–7.7)
Neutrophils Relative %: 89 %
Platelets: 616 10*3/uL — ABNORMAL HIGH (ref 150–400)
RBC: 5.42 MIL/uL — ABNORMAL HIGH (ref 3.87–5.11)
RDW: 16.5 % — ABNORMAL HIGH (ref 11.5–15.5)
WBC: 11.7 10*3/uL — ABNORMAL HIGH (ref 4.0–10.5)
nRBC: 0 % (ref 0.0–0.2)

## 2020-02-20 LAB — COMPREHENSIVE METABOLIC PANEL
ALT: 28 U/L (ref 0–44)
AST: 21 U/L (ref 15–41)
Albumin: 2.4 g/dL — ABNORMAL LOW (ref 3.5–5.0)
Alkaline Phosphatase: 73 U/L (ref 38–126)
Anion gap: 11 (ref 5–15)
BUN: 24 mg/dL — ABNORMAL HIGH (ref 6–20)
CO2: 26 mmol/L (ref 22–32)
Calcium: 9.1 mg/dL (ref 8.9–10.3)
Chloride: 99 mmol/L (ref 98–111)
Creatinine, Ser: 1.03 mg/dL — ABNORMAL HIGH (ref 0.44–1.00)
GFR calc Af Amer: >60 mL/min (ref >60)
GFR calc non Af Amer: >60 mL/min (ref >60)
Glucose, Bld: 266 mg/dL — ABNORMAL HIGH (ref 70–99)
Potassium: 4.3 mmol/L (ref 3.5–5.1)
Sodium: 136 mmol/L (ref 135–145)
Total Bilirubin: 0.6 mg/dL (ref 0.3–1.2)
Total Protein: 6.9 g/dL (ref 6.5–8.1)

## 2020-02-20 LAB — GLUCOSE, CAPILLARY
Glucose-Capillary: 243 mg/dL — ABNORMAL HIGH (ref 70–99)
Glucose-Capillary: 341 mg/dL — ABNORMAL HIGH (ref 70–99)
Glucose-Capillary: 314 mg/dL — ABNORMAL HIGH (ref 70–99)
Glucose-Capillary: 153 mg/dL — ABNORMAL HIGH (ref 70–99)

## 2020-02-20 LAB — MAGNESIUM: Magnesium: 2.4 mg/dL (ref 1.7–2.4)

## 2020-02-20 LAB — PHOSPHORUS: Phosphorus: 4.2 mg/dL (ref 2.5–4.6)

## 2020-02-20 LAB — C-REACTIVE PROTEIN: CRP: 5.1 mg/dL — ABNORMAL HIGH (ref <1.0)

## 2020-02-20 LAB — FERRITIN: Ferritin: 123 ng/mL (ref 11–307)

## 2020-02-20 LAB — D-DIMER, QUANTITATIVE: D-Dimer, Quant: 1.93 ug/mL-FEU — ABNORMAL HIGH (ref 0.00–0.50)

## 2020-02-20 MED ORDER — DEXAMETHASONE 6 MG PO TABS
6.0000 mg | ORAL_TABLET | Freq: Every day | ORAL | Status: DC
  Administered 2020-02-20 – 2020-02-22 (×3): 6 mg via ORAL
  Filled 2020-02-20 (×3): qty 1

## 2020-02-20 MED ORDER — INSULIN ASPART 100 UNIT/ML ~~LOC~~ SOLN
8.0000 [IU] | Freq: Three times a day (TID) | SUBCUTANEOUS | Status: DC
  Administered 2020-02-20 – 2020-02-22 (×8): 8 [IU] via SUBCUTANEOUS

## 2020-02-20 MED ORDER — INSULIN ASPART 100 UNIT/ML ~~LOC~~ SOLN
5.0000 [IU] | Freq: Three times a day (TID) | SUBCUTANEOUS | Status: DC
  Administered 2020-02-20: 5 [IU] via SUBCUTANEOUS

## 2020-02-20 NOTE — Progress Notes (Signed)
   Subjective: Patient reports feeling about the same today. She denies any chest pain, abdominal pain, or diarrhea. She is continuing to have heartburn.   Objective:  Vital signs in last 24 hours: Vitals:   02/19/20 1515 02/19/20 2011 02/19/20 2100 02/20/20 0507  BP:   (!) 168/81 (!) 146/70  Pulse:  65 67 (!) 54  Resp:  18 20 18   Temp:   (!) 97.5 F (36.4 C) (!) 97.5 F (36.4 C)  TempSrc:   Oral Oral  SpO2: 92% 92% 93% 97%  Weight:      Height:        Supplemental Oxygen: 97% on 6 L high flow nasal cannula  Constitution: NAD, appears stated age Cardio: RRR, no m/r/g, no LE edema  Respiratory: CTA, no w/r/r Abdominal: NTTP, soft, non-distended MSK: moving all extremities Neuro: normal affect, a&ox3 Skin: c/d/i   Assessment/Plan:  Active Problems:   COVID-19 virus infection  54 year old female with known history of hypertension presenting to the ED with worsening shortness of breath found to be positive for COVID-19.  COVID-19 Hospital day4.Saturating at 97% on 6L HFNC. Inflammatory markers are trending downward. Continued to encourage pulmonary rehab.   - tussionex q12h prn, robitussen q4h prn - remdesivir day4/5 - switched from solumedrol to dexamethasone 6 mg po, day 4/5 of steroids  - baricitinib day 3 - IS, flutter valve - will need covid vaccine after discharge  Hyperglycemia Blood glucoses remain elevated. Started on Levemir 10 units qhs and insulin apart 8 units TID WC.  - Levemir 10 units qhs - Insulin aspart 5 units TID WC - SSI - Hgb A1c 7.9, will need to follow up with PCP   53 NWG:NFAOZHY Diet:Heart Code:Full Code  Dispo: Anticipated dischargepending clinical improvement.  Sonya Clark N, DO 02/20/2020, 7:42 AM Pager: [phone] After 5pm on weekdays and 1pm on weekends: On Call Pager: [phone]

## 2020-02-21 LAB — GLUCOSE, CAPILLARY
Glucose-Capillary: 95 mg/dL (ref 70–99)
Glucose-Capillary: 145 mg/dL — ABNORMAL HIGH (ref 70–99)
Glucose-Capillary: 229 mg/dL — ABNORMAL HIGH (ref 70–99)
Glucose-Capillary: 148 mg/dL — ABNORMAL HIGH (ref 70–99)

## 2020-02-21 LAB — CBC WITH DIFFERENTIAL/PLATELET
Abs Immature Granulocytes: 0.11 10*3/uL — ABNORMAL HIGH (ref 0.00–0.07)
Basophils Absolute: 0 10*3/uL (ref 0.0–0.1)
Basophils Relative: 0 %
Eosinophils Absolute: 0 10*3/uL (ref 0.0–0.5)
Eosinophils Relative: 0 %
HCT: 41.3 % (ref 36.0–46.0)
Hemoglobin: 12.5 g/dL (ref 12.0–15.0)
Immature Granulocytes: 1 %
Lymphocytes Relative: 14 %
Lymphs Abs: 1.4 10*3/uL (ref 0.7–4.0)
MCH: 23 pg — ABNORMAL LOW (ref 26.0–34.0)
MCHC: 30.3 g/dL (ref 30.0–36.0)
MCV: 75.9 fL — ABNORMAL LOW (ref 80.0–100.0)
Monocytes Absolute: 0.4 10*3/uL (ref 0.1–1.0)
Monocytes Relative: 4 %
Neutro Abs: 8.3 10*3/uL — ABNORMAL HIGH (ref 1.7–7.7)
Neutrophils Relative %: 81 %
Platelets: 613 10*3/uL — ABNORMAL HIGH (ref 150–400)
RBC: 5.44 MIL/uL — ABNORMAL HIGH (ref 3.87–5.11)
RDW: 16.3 % — ABNORMAL HIGH (ref 11.5–15.5)
WBC: 10.2 10*3/uL (ref 4.0–10.5)
nRBC: 0 % (ref 0.0–0.2)

## 2020-02-21 LAB — COMPREHENSIVE METABOLIC PANEL
ALT: 26 U/L (ref 0–44)
AST: 20 U/L (ref 15–41)
Albumin: 2.3 g/dL — ABNORMAL LOW (ref 3.5–5.0)
Alkaline Phosphatase: 67 U/L (ref 38–126)
Anion gap: 9 (ref 5–15)
BUN: 25 mg/dL — ABNORMAL HIGH (ref 6–20)
CO2: 27 mmol/L (ref 22–32)
Calcium: 9.2 mg/dL (ref 8.9–10.3)
Chloride: 103 mmol/L (ref 98–111)
Creatinine, Ser: 0.94 mg/dL (ref 0.44–1.00)
GFR calc Af Amer: >60 mL/min (ref >60)
GFR calc non Af Amer: >60 mL/min (ref >60)
Glucose, Bld: 120 mg/dL — ABNORMAL HIGH (ref 70–99)
Potassium: 3.8 mmol/L (ref 3.5–5.1)
Sodium: 139 mmol/L (ref 135–145)
Total Bilirubin: 0.3 mg/dL (ref 0.3–1.2)
Total Protein: 6.5 g/dL (ref 6.5–8.1)

## 2020-02-21 LAB — D-DIMER, QUANTITATIVE: D-Dimer, Quant: 1.76 ug/mL-FEU — ABNORMAL HIGH (ref 0.00–0.50)

## 2020-02-21 LAB — FERRITIN: Ferritin: 109 ng/mL (ref 11–307)

## 2020-02-21 LAB — PHOSPHORUS: Phosphorus: 3.6 mg/dL (ref 2.5–4.6)

## 2020-02-21 LAB — C-REACTIVE PROTEIN: CRP: 2.8 mg/dL — ABNORMAL HIGH (ref <1.0)

## 2020-02-21 LAB — MAGNESIUM: Magnesium: 2.1 mg/dL (ref 1.7–2.4)

## 2020-02-21 NOTE — Progress Notes (Addendum)
   Subjective: Patient reports feeling fatigued and weak today. She states she feels her breathing has improved. Denies any chest pain, abdominal pain or diarrhea.   Objective:  Vital signs in last 24 hours: Vitals:   02/20/20 2017 02/20/20 2206 02/20/20 2306 02/21/20 0513  BP:  (!) 152/94  (!) 141/82  Pulse: 73 68  65  Resp: 20 20  (!) 22  Temp:  97.9 F (36.6 C)  97.8 F (36.6 C)  TempSrc:  Oral  Oral  SpO2: 100% 100% 99% 100%  Weight:      Height:        Supplemental Oxygen: 98% on 5L [place]  Constitution: NAD, appears stated age Cardio: RRR, no m/r/g, no LE edema  Respiratory: CTA, no w/r/r Abdominal: NTTP, soft, non-distended MSK: moving all extremities Neuro: normal affect, a&ox3 Skin: c/d/i   Assessment/Plan:  Active Problems:   COVID-19 virus infection  54 year old female with known history of hypertension presenting to the ED with worsening shortness of breath found to be positive for COVID-19.  COVID-19 Hospital day5.Saturating at 97% on 5L HFNC. Inflammatory markers are trending downward. Continued to encourage pulmonary rehab. Will have PT work with patient to help improve deconditioning.   - tussionex q12h prn, robitussen q4h prn - remdesivir day5/5 -dexamethasone day 5 of steroids  - baricitinib day4 - IS, flutter valve - PT - will need covid vaccine after discharge  Hyperglycemia Blood glucose improving, 145. -Levemir 10 units qhs - Insulin aspart 8 units TID WC - SSI - Hgb A1c 7.9, will need to follow up with PCP   HKV:QQVZDGL OVF:IEPP Diet:Heart Code:Full Code  Dispo: Anticipated dischargepending clinical improvement.  Cornelious Diven N, DO 02/21/2020, 7:33 AM Pager: [phone] After 5pm on weekdays and 1pm on weekends: On Call Pager: [phone]

## 2020-02-21 NOTE — Evaluation (Signed)
Physical Therapy Evaluation Patient Details Name: Sonya Clark MRN: [id number] DOB: 1966-03-17 Today's Date: 02/21/2020   History of Present Illness  Pt is a 54 y.o. female admitted 02/17/20 with worsening SOB, headache, diarrhea, nausea; workup for acute hypoxic respiratory failure secondary to COVID-19. PMH includes HTN, obesity. Pt received initial dose of vaccine 3-wks prior, scheduled for 2nd dose next month.    Clinical Impression  Pt presents with an overall decrease in functional mobility secondary to above. PTA, pt independent, working and lives with boyfriend. Initiated education re: current condition, O2 needs, activity recommendations, positioning, therex, energy conservation, and importance of mobility. Today, pt able to stand and take a few steps with min guard; limited by c/o headache, shoulder/throat pain, fatigue, SOB, reporting, "I have to lay back down." Unable to get reliable pulse ox read with activity; SpO2 >/93% on RA at rest. Pt adamantly declining transfer to recliner or laying prone. Pt would benefit from continued acute PT services to maximize functional mobility and independence prior to d/c home.     Follow Up Recommendations No PT follow up;Supervision for mobility/OOB (pending progression)    Equipment Recommendations  None recommended by PT    Recommendations for Other Services       Precautions / Restrictions Precautions Precautions: Fall;Other (comment) Precaution Comments: Watch SpO2 Restrictions Weight Bearing Restrictions: No      Mobility  Bed Mobility Overal bed mobility: Modified Independent             General bed mobility comments: HOB elevated  Transfers Overall transfer level: Needs assistance Equipment used: None Transfers: Sit to/from Stand Sit to Stand: Min guard         General transfer comment: Min guard for balance due to pt swaying, reaching to counter for UE support C/o SOB, fatigue, shoulder/neck pain, and headache,  all worse upon standing  Ambulation/Gait Ambulation/Gait assistance: Min guard Gait Distance (Feet): 4 Feet Assistive device: None   Gait velocity: Decreased   General Gait Details: Initial marching in place, then steps forwards/backwards at EOB; pt declining additional mobility, stating, "I have to sit" secondary to fatigue/pain/SOB. Difficult getting reliable pulse ox read with activity  Stairs            Wheelchair Mobility    Modified Rankin (Stroke Patients Only)       Balance Overall balance assessment: Needs assistance   Sitting balance-Leahy Scale: Good       Standing balance-Leahy Scale: Fair                               Pertinent Vitals/Pain Pain Assessment: Faces Faces Pain Scale: Hurts little more Pain Location: Bilateral shoulders, anterior neck/throat Pain Descriptors / Indicators: Discomfort;Sore Pain Intervention(s): Monitored during session;Heat applied    Home Living Family/patient expects to be discharged to:: Private residence Living Arrangements: Spouse/significant other (boyfriend) Available Help at Discharge: Friend(s);Available 24 hours/day Type of Home: House Home Access: Stairs to enter Entrance Stairs-Rails: Right Entrance Stairs-Number of Steps: 4-5 Home Layout: One level Home Equipment: None Additional Comments: Believes she got COVID from first dose of vaccine; boyfriend not sick    Prior Function Level of Independence: Independent         Comments: Works as Museum/gallery exhibitions officer        Extremity/Trunk Assessment   Upper Extremity Assessment Upper Extremity Assessment: Overall WFL for tasks assessed    Lower Extremity Assessment Lower  Extremity Assessment: Generalized weakness       Communication   Communication: No difficulties  Cognition Arousal/Alertness: Awake/alert Behavior During Therapy: WFL for tasks assessed/performed;Flat affect Overall Cognitive Status: Within  Functional Limits for tasks assessed                                 General Comments: WFL for simple tasks, although internally distracted by throat soreness and SOB      General Comments      Exercises     Assessment/Plan    PT Assessment Patient needs continued PT services  PT Problem List Decreased strength;Decreased activity tolerance;Decreased balance;Decreased mobility;Decreased knowledge of use of DME;Cardiopulmonary status limiting activity       PT Treatment Interventions DME instruction;Gait training;Stair training;Functional mobility training;Therapeutic activities;Therapeutic exercise;Balance training;Patient/family education    PT Goals (Current goals can be found in the Care Plan section)  Acute Rehab PT Goals Patient Stated Goal: "I need to lay back down" PT Goal Formulation: With patient Time For Goal Achievement: 03/06/20 Potential to Achieve Goals: Good    Frequency Min 3X/week   Barriers to discharge        Co-evaluation               AM-PAC PT "6 Clicks" Mobility  Outcome Measure Help needed turning from your back to your side while in a flat bed without using bedrails?: None Help needed moving from lying on your back to sitting on the side of a flat bed without using bedrails?: None Help needed moving to and from a bed to a chair (including a wheelchair)?: A Little Help needed standing up from a chair using your arms (e.g., wheelchair or bedside chair)?: A Little Help needed to walk in hospital room?: A Little Help needed climbing 3-5 steps with a railing? : A Little 6 Click Score: 20    End of Session   Activity Tolerance: Patient limited by fatigue;Patient limited by pain Patient left: in bed;with call bell/phone within reach Nurse Communication: Mobility status PT Visit Diagnosis: Other abnormalities of gait and mobility (R26.89)    Time: 5852-7782 PT Time Calculation (min) (ACUTE ONLY): 19 min   Charges:   PT  Evaluation $PT Eval Moderate Complexity: 1 Mod     Ina Homes, PT, DPT Acute Rehabilitation Services  Pager [phone] Office [phone]  Malachy Chamber 02/21/2020, 5:04 PM

## 2020-02-21 NOTE — Progress Notes (Signed)
Patient resting on room air with oxygen sats 95%, ambulating to the bathroom on room air sats dropped to 89% briefly, returned back to the 90s again once resting at the toilet.

## 2020-02-21 NOTE — TOC Progression Note (Signed)
Transition of Care [facility]) - Progression Note    Patient Details  Name: Eleri Ruben MRN: [id number] Date of Birth: 1965-08-26  Transition of Care [facility]) CM/SW Contact  Lockie Pares, RN Phone Number: 02/21/2020, 4:47 PM  Clinical Narrative:    Patient off oxygen, doing better, does not qualify for oxygen.  Is set up at [facility] care center for follow up.    Expected Discharge Plan: Home/Self Care Barriers to Discharge: Continued Medical Work up  Expected Discharge Plan and Services Expected Discharge Plan: Home/Self Care   Discharge Planning Services: Follow-up appt scheduled   Living arrangements for the past 2 months: Single Family Home                                       Social Determinants of Health (SDOH) Interventions    Readmission Risk Interventions No flowsheet data found.

## 2020-02-22 LAB — CBC WITH DIFFERENTIAL/PLATELET
Abs Immature Granulocytes: 0.1 10*3/uL — ABNORMAL HIGH (ref 0.00–0.07)
Basophils Absolute: 0 10*3/uL (ref 0.0–0.1)
Basophils Relative: 0 %
Eosinophils Absolute: 0.1 10*3/uL (ref 0.0–0.5)
Eosinophils Relative: 1 %
HCT: 41.5 % (ref 36.0–46.0)
Hemoglobin: 12.5 g/dL (ref 12.0–15.0)
Immature Granulocytes: 1 %
Lymphocytes Relative: 20 %
Lymphs Abs: 2 10*3/uL (ref 0.7–4.0)
MCH: 22.8 pg — ABNORMAL LOW (ref 26.0–34.0)
MCHC: 30.1 g/dL (ref 30.0–36.0)
MCV: 75.7 fL — ABNORMAL LOW (ref 80.0–100.0)
Monocytes Absolute: 0.4 10*3/uL (ref 0.1–1.0)
Monocytes Relative: 4 %
Neutro Abs: 7.5 10*3/uL (ref 1.7–7.7)
Neutrophils Relative %: 74 %
Platelets: 582 10*3/uL — ABNORMAL HIGH (ref 150–400)
RBC: 5.48 MIL/uL — ABNORMAL HIGH (ref 3.87–5.11)
RDW: 16.1 % — ABNORMAL HIGH (ref 11.5–15.5)
WBC: 10 10*3/uL (ref 4.0–10.5)
nRBC: 0 % (ref 0.0–0.2)

## 2020-02-22 LAB — COMPREHENSIVE METABOLIC PANEL
ALT: 24 U/L (ref 0–44)
AST: 20 U/L (ref 15–41)
Albumin: 2.3 g/dL — ABNORMAL LOW (ref 3.5–5.0)
Alkaline Phosphatase: 62 U/L (ref 38–126)
Anion gap: 9 (ref 5–15)
BUN: 24 mg/dL — ABNORMAL HIGH (ref 6–20)
CO2: 26 mmol/L (ref 22–32)
Calcium: 8.7 mg/dL — ABNORMAL LOW (ref 8.9–10.3)
Chloride: 98 mmol/L (ref 98–111)
Creatinine, Ser: 0.99 mg/dL (ref 0.44–1.00)
GFR calc Af Amer: >60 mL/min (ref >60)
GFR calc non Af Amer: >60 mL/min (ref >60)
Glucose, Bld: 130 mg/dL — ABNORMAL HIGH (ref 70–99)
Potassium: 3.9 mmol/L (ref 3.5–5.1)
Sodium: 133 mmol/L — ABNORMAL LOW (ref 135–145)
Total Bilirubin: 0.4 mg/dL (ref 0.3–1.2)
Total Protein: 6.4 g/dL — ABNORMAL LOW (ref 6.5–8.1)

## 2020-02-22 LAB — GLUCOSE, CAPILLARY
Glucose-Capillary: 97 mg/dL (ref 70–99)
Glucose-Capillary: 193 mg/dL — ABNORMAL HIGH (ref 70–99)
Glucose-Capillary: 288 mg/dL — ABNORMAL HIGH (ref 70–99)

## 2020-02-22 LAB — D-DIMER, QUANTITATIVE: D-Dimer, Quant: 1.85 ug/mL-FEU — ABNORMAL HIGH (ref 0.00–0.50)

## 2020-02-22 LAB — FERRITIN: Ferritin: 110 ng/mL (ref 11–307)

## 2020-02-22 LAB — C-REACTIVE PROTEIN: CRP: 1.4 mg/dL — ABNORMAL HIGH (ref <1.0)

## 2020-02-22 LAB — MAGNESIUM: Magnesium: 2 mg/dL (ref 1.7–2.4)

## 2020-02-22 LAB — PHOSPHORUS: Phosphorus: 4.4 mg/dL (ref 2.5–4.6)

## 2020-02-22 MED ORDER — LIVING WELL WITH DIABETES BOOK
Freq: Once | Status: AC
  Filled 2020-02-22: qty 1

## 2020-02-22 MED ORDER — METFORMIN HCL 850 MG PO TABS: 850.0000 mg | ORAL_TABLET | Freq: Every day | ORAL | 0 refills | Status: DC

## 2020-02-22 MED ORDER — METFORMIN HCL 500 MG PO TABS: 500.0000 mg | ORAL_TABLET | Freq: Every day | ORAL | 11 refills | Status: AC

## 2020-02-22 MED FILL — metFORMIN HCL 500 MG TABS: 500 | 30 days supply | Qty: 30 | Fill #0

## 2020-02-22 NOTE — Plan of Care (Signed)
°  Problem: Education: Goal: Knowledge of General Education information will improve Description: Including pain rating scale, medication(s)/side effects and non-pharmacologic comfort measures Outcome: Progressing   Problem: Health Behavior/Discharge Planning: Goal: Ability to manage health-related needs will improve Outcome: Progressing  IDT discussing plan with patient for walk test.  Problem: Nutrition: Goal: Adequate nutrition will be maintained Outcome: Progressing  Patient consumes >50% of meals.

## 2020-02-22 NOTE — Progress Notes (Signed)
Patient discharged home per orders. Discharge instructions reviewed with patient. Patient verbalizes understanding. IV and tele discontinued per orders previous shift. Belongings packed up by patient. Denies needs at this time. Patient off unit via w/c by Lawanna Kobus, NT.

## 2020-02-22 NOTE — Discharge Instructions (Signed)
 COVID-19 COVID-19 is a respiratory infection that is caused by a virus called severe acute respiratory syndrome coronavirus 2 (SARS-CoV-2). The disease is also known as coronavirus disease or novel coronavirus. In some people, the virus may not cause any symptoms. In others, it may cause a serious infection. The infection can get worse quickly and can lead to complications, such as:  Pneumonia, or infection of the lungs.  Acute respiratory distress syndrome or ARDS. This is a condition in which fluid build-up in the lungs prevents the lungs from filling with air and passing oxygen into the blood.  Acute respiratory failure. This is a condition in which there is not enough oxygen passing from the lungs to the body or when carbon dioxide is not passing from the lungs out of the body.  Sepsis or septic shock. This is a serious bodily reaction to an infection.  Blood clotting problems.  Secondary infections due to bacteria or fungus.  Organ failure. This is when your body's organs stop working. The virus that causes COVID-19 is contagious. This means that it can spread from person to person through droplets from coughs and sneezes (respiratory secretions). What are the causes? This illness is caused by a virus. You may catch the virus by:  Breathing in droplets from an infected person. Droplets can be spread by a person breathing, speaking, singing, coughing, or sneezing.  Touching something, like a table or a doorknob, that was exposed to the virus (contaminated) and then touching your mouth, nose, or eyes. What increases the risk? Risk for infection You are more likely to be infected with this virus if you:  Are within 6 feet (2 meters) of a person with COVID-19.  Provide care for or live with a person who is infected with COVID-19.  Spend time in crowded indoor spaces or live in shared housing. Risk for serious illness You are more likely to become seriously ill from the virus if  you:  Are 50 years of age or older. The higher your age, the more you are at risk for serious illness.  Live in a nursing home or long-term care facility.  Have cancer.  Have a long-term (chronic) disease such as: ? Chronic lung disease, including chronic obstructive pulmonary disease or asthma. ? A long-term disease that lowers your body's ability to fight infection (immunocompromised). ? Heart disease, including heart failure, a condition in which the arteries that lead to the heart become narrow or blocked (coronary artery disease), a disease which makes the heart muscle thick, weak, or stiff (cardiomyopathy). ? Diabetes. ? Chronic kidney disease. ? Sickle cell disease, a condition in which red blood cells have an abnormal "sickle" shape. ? Liver disease.  Are obese. What are the signs or symptoms? Symptoms of this condition can range from mild to severe. Symptoms may appear any time from 2 to 14 days after being exposed to the virus. They include:  A fever or chills.  A cough.  Difficulty breathing.  Headaches, body aches, or muscle aches.  Runny or stuffy (congested) nose.  A sore throat.  New loss of taste or smell. Some people may also have stomach problems, such as nausea, vomiting, or diarrhea. Other people may not have any symptoms of COVID-19. How is this diagnosed? This condition may be diagnosed based on:  Your signs and symptoms, especially if: ? You live in an area with a COVID-19 outbreak. ? You recently traveled to or from an area where the virus is common. ?   You provide care for or live with a person who was diagnosed with COVID-19. ? You were exposed to a person who was diagnosed with COVID-19.  A physical exam.  Lab tests, which may include: ? Taking a sample of fluid from the back of your nose and throat (nasopharyngeal fluid), your nose, or your throat using a swab. ? A sample of mucus from your lungs (sputum). ? Blood tests.  Imaging tests,  which may include, X-rays, CT scan, or ultrasound. How is this treated? At present, there is no medicine to treat COVID-19. Medicines that treat other diseases are being used on a trial basis to see if they are effective against COVID-19. Your health care provider will talk with you about ways to treat your symptoms. For most people, the infection is mild and can be managed at home with rest, fluids, and over-the-counter medicines. Treatment for a serious infection usually takes places in a hospital intensive care unit (ICU). It may include one or more of the following treatments. These treatments are given until your symptoms improve.  Receiving fluids and medicines through an IV.  Supplemental oxygen. Extra oxygen is given through a tube in the nose, a face mask, or a hood.  Positioning you to lie on your stomach (prone position). This makes it easier for oxygen to get into the lungs.  Continuous positive airway pressure (CPAP) or bi-level positive airway pressure (BPAP) machine. This treatment uses mild air pressure to keep the airways open. A tube that is connected to a motor delivers oxygen to the body.  Ventilator. This treatment moves air into and out of the lungs by using a tube that is placed in your windpipe.  Tracheostomy. This is a procedure to create a hole in the neck so that a breathing tube can be inserted.  Extracorporeal membrane oxygenation (ECMO). This procedure gives the lungs a chance to recover by taking over the functions of the heart and lungs. It supplies oxygen to the body and removes carbon dioxide. Follow these instructions at home: Lifestyle  If you are sick, stay home except to get medical care. Your health care provider will tell you how long to stay home. Call your health care provider before you go for medical care.  Rest at home as told by your health care provider.  Do not use any products that contain nicotine or tobacco, such as cigarettes,  e-cigarettes, and chewing tobacco. If you need help quitting, ask your health care provider.  Return to your normal activities as told by your health care provider. Ask your health care provider what activities are safe for you. General instructions  Take over-the-counter and prescription medicines only as told by your health care provider.  Drink enough fluid to keep your urine pale yellow.  Keep all follow-up visits as told by your health care provider. This is important. How is this prevented?  There is no vaccine to help prevent COVID-19 infection. However, there are steps you can take to protect yourself and others from this virus. To protect yourself:   Do not travel to areas where COVID-19 is a risk. The areas where COVID-19 is reported change often. To identify high-risk areas and travel restrictions, check the CDC travel website: wwwnc.cdc.gov/travel/notices  If you live in, or must travel to, an area where COVID-19 is a risk, take precautions to avoid infection. ? Stay away from people who are sick. ? Wash your hands often with soap and water for 20 seconds. If soap and   water are not available, use an alcohol-based hand sanitizer. ? Avoid touching your mouth, face, eyes, or nose. ? Avoid going out in public, follow guidance from your state and local health authorities. ? If you must go out in public, wear a cloth face covering or face mask. Make sure your mask covers your nose and mouth. ? Avoid crowded indoor spaces. Stay at least 6 feet (2 meters) away from others. ? Disinfect objects and surfaces that are frequently touched every day. This may include:  Counters and tables.  Doorknobs and light switches.  Sinks and faucets.  Electronics, such as phones, remote controls, keyboards, computers, and tablets. To protect others: If you have symptoms of COVID-19, take steps to prevent the virus from spreading to others.  If you think you have a COVID-19 infection, contact  your health care provider right away. Tell your health care team that you think you may have a COVID-19 infection.  Stay home. Leave your house only to seek medical care. Do not use public transport.  Do not travel while you are sick.  Wash your hands often with soap and water for 20 seconds. If soap and water are not available, use alcohol-based hand sanitizer.  Stay away from other members of your household. Let healthy household members care for children and pets, if possible. If you have to care for children or pets, wash your hands often and wear a mask. If possible, stay in your own room, separate from others. Use a different bathroom.  Make sure that all people in your household wash their hands well and often.  Cough or sneeze into a tissue or your sleeve or elbow. Do not cough or sneeze into your hand or into the air.  Wear a cloth face covering or face mask. Make sure your mask covers your nose and mouth. Where to find more information  Centers for Disease Control and Prevention: www.cdc.gov/coronavirus/2019-ncov/index.html  World Health Organization: www.who.int/health-topics/coronavirus Contact a health care provider if:  You live in or have traveled to an area where COVID-19 is a risk and you have symptoms of the infection.  You have had contact with someone who has COVID-19 and you have symptoms of the infection. Get help right away if:  You have trouble breathing.  You have pain or pressure in your chest.  You have confusion.  You have bluish lips and fingernails.  You have difficulty waking from sleep.  You have symptoms that get worse. These symptoms may represent a serious problem that is an emergency. Do not wait to see if the symptoms will go away. Get medical help right away. Call your local emergency services (911 in the [place] Do not drive yourself to the hospital. Let the emergency medical personnel know if you think you have  COVID-19. Summary  COVID-19 is a respiratory infection that is caused by a virus. It is also known as coronavirus disease or novel coronavirus. It can cause serious infections, such as pneumonia, acute respiratory distress syndrome, acute respiratory failure, or sepsis.  The virus that causes COVID-19 is contagious. This means that it can spread from person to person through droplets from breathing, speaking, singing, coughing, or sneezing.  You are more likely to develop a serious illness if you are 50 years of age or older, have a weak immune system, live in a nursing home, or have chronic disease.  There is no medicine to treat COVID-19. Your health care provider will talk with you about ways to treat your   symptoms.  Take steps to protect yourself and others from infection. Wash your hands often and disinfect objects and surfaces that are frequently touched every day. Stay away from people who are sick and wear a mask if you are sick. This information is not intended to replace advice given to you by your health care provider. Make sure you discuss any questions you have with your health care provider. Document Revised: 03/23/2019 Document Reviewed: 06/29/2018 Elsevier Patient Education  2020 Elsevier Inc.  COVID-19: How to Protect Yourself and Others Know how it spreads  There is currently no vaccine to prevent coronavirus disease 2019 (COVID-19).  The best way to prevent illness is to avoid being exposed to this virus.  The virus is thought to spread mainly from person-to-person. ? Between people who are in close contact with one another (within about 6 feet). ? Through respiratory droplets produced when an infected person coughs, sneezes or talks. ? These droplets can land in the mouths or noses of people who are nearby or possibly be inhaled into the lungs. ? COVID-19 may be spread by people who are not showing symptoms. Everyone should Clean your hands often  Wash your hands  often with soap and water for at least 20 seconds especially after you have been in a public place, or after blowing your nose, coughing, or sneezing.  If soap and water are not readily available, use a hand sanitizer that contains at least 60% alcohol. Cover all surfaces of your hands and rub them together until they feel dry.  Avoid touching your eyes, nose, and mouth with unwashed hands. Avoid close contact  Limit contact with others as much as possible.  Avoid close contact with people who are sick.  Put distance between yourself and other people. ? Remember that some people without symptoms may be able to spread virus. ? This is especially important for people who are at higher risk of getting very [employer] Cover your mouth and nose with a mask when around others  You could spread COVID-19 to others even if you do not feel sick.  Everyone should wear a mask in public settings and when around people not living in their household, especially when social distancing is difficult to maintain. ? Masks should not be placed on young children under age 45, anyone who has trouble breathing, or is unconscious, incapacitated or otherwise unable to remove the mask without assistance.  The mask is meant to protect other people in case you are infected.  Do NOT use a facemask meant for a Research scientist (physical sciences).  Continue to keep about 6 feet between yourself and others. The mask is not a substitute for social distancing. Cover coughs and sneezes  Always cover your mouth and nose with a tissue when you cough or sneeze or use the inside of your elbow.  Throw used tissues in the trash.  Immediately wash your hands with soap and water for at least 20 seconds. If soap and water are not readily available, clean your hands with a hand sanitizer that contains at least 60% alcohol. Clean and disinfect  Clean AND disinfect  frequently touched surfaces daily. This includes tables, doorknobs, light switches, countertops, handles, desks, phones, keyboards, toilets, faucets, and sinks. [employer]timeonline.com  If surfaces are dirty, clean them: Use detergent or soap and water prior to disinfection.  Then, use a household disinfectant. You can see a list of EPA-registered household disinfectants here. [employer] 02/07/2019 This information is not intended to replace advice given to  you by your health care provider. Make sure you discuss any questions you have with your health care provider. Document Revised: 02/15/2019 Document Reviewed: 12/14/2018 Elsevier Patient Education  2020 Elsevier Inc. Sonya Clark,  You were hospitalized due to COVID-19. You were treated with the best medications we currently have for COVID-19 and steroids. You also required supplemental oxygen while you were here but will not need to continue using this at home.  While you were here we also diagnosed you with Type II Diabetes. You were treated with insulin while you were here. I have prescribed you metformin 500 mg to start taking once a day. It is important to start this medication to help control your high blood sugars.  Please call [facility] on Monday to schedule a follow up appointment for a post covid appointment as well as to follow up for your new diagnosis of type II diabetes.  I am glad to see you are doing so much better and hoping for a strong recovery.  Thank you for allowing [place] to be a part of your care!

## 2020-02-22 NOTE — Progress Notes (Signed)
Ambulated patient 80 feet. Saturations 88-94% on room air, patient having to stop to rest 1x due to dyspnea but did not further desaturate and was able to complete the rest of walk. Patient returned to bed, O2 sats 100% on room air.

## 2020-02-22 NOTE — Progress Notes (Signed)
Living well with diabetes book reviewed extensively with patient.

## 2020-02-22 NOTE — Evaluation (Signed)
Occupational Therapy Evaluation Patient Details Name: Sonya Clark MRN: [id number] DOB: 22-Feb-1966 Today's Date: 02/22/2020    History of Present Illness Pt is a 54 y.o. female admitted 02/17/20 with worsening SOB, headache, diarrhea, nausea; workup for acute hypoxic respiratory failure secondary to COVID-19. PMH includes HTN, obesity. Pt received initial dose of vaccine 3-wks prior, scheduled for 2nd dose next month.   Clinical Impression   PTA pt living with boyfriend and functioning at independent community level. At time of eval, pt presents with ability to complete bed mobility at mod I and sit <> stands at independent level. Pt is currently completing BADL at supervision level. Noted pt with poor activity tolerance, very slow cautious movement and requiring UE support to maintain balance. Pt difficult to obtain accurate pleth on, but suspect based of intermittent O2 readings pt currently needs 2L [place] to maintain SpO2 during functional activity. Pt with DOE 3/4 for short distances. Given current status, anticipate pt will progress well without OT follow up. OT will continue to follow per POC listed below.    Follow Up Recommendations  Supervision - Intermittent    Equipment Recommendations  Tub/shower seat    Recommendations for Other Services       Precautions / Restrictions Precautions Precautions: Fall;Other (comment) Precaution Comments: Watch SpO2 Restrictions Weight Bearing Restrictions: No      Mobility Bed Mobility Overal bed mobility: Modified Independent                Transfers Overall transfer level: Needs assistance Equipment used: 1 person hand held assist Transfers: Sit to/from Stand Sit to Stand: Min guard         General transfer comment: UE support for improved balance    Balance Overall balance assessment: Needs assistance   Sitting balance-Leahy Scale: Good     Standing balance support: Bilateral upper extremity supported;During functional  activity Standing balance-Leahy Scale: Fair                             ADL either performed or assessed with clinical judgement   ADL                                         General ADL Comments: Pt shows ability to complete ADL at supervision level with increased time of rest breaks and education of ECS. Pt moving very and guarded and slow, requires UE support     Vision Patient Visual Report: No change from baseline       Perception     Praxis      Pertinent Vitals/Pain Pain Assessment: Faces Faces Pain Scale: Hurts little more Pain Location: Bilateral shoulders, anterior neck/throat Pain Descriptors / Indicators: Discomfort;Sore Pain Intervention(s): Monitored during session     Hand Dominance     Extremity/Trunk Assessment Upper Extremity Assessment Upper Extremity Assessment: Generalized weakness   Lower Extremity Assessment Lower Extremity Assessment: Generalized weakness       Communication Communication Communication: No difficulties   Cognition Arousal/Alertness: Awake/alert Behavior During Therapy: Flat affect Overall Cognitive Status: Within Functional Limits for tasks assessed                                 General Comments: flat affect, minimally responsive   General Comments  Exercises     Shoulder Instructions      Home Living Family/patient expects to be discharged to:: Private residence Living Arrangements: Spouse/significant other (boyfriend) Available Help at Discharge: Friend(s);Available 24 hours/day Type of Home: House Home Access: Stairs to enter [employer] of Steps: 4-5 Entrance Stairs-Rails: Right Home Layout: One level     Bathroom Shower/Tub: Chief Strategy Officer: Standard     Home Equipment: None   Additional Comments: Believes she got COVID from first dose of vaccine; boyfriend not sick      Prior Functioning/Environment Level of  Independence: Independent        Comments: 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[employer], [employer]        OT Problem List: Decreased strength;Decreased knowledge of use of DME or AE;Decreased knowledge of precautions;Decreased activity tolerance;Cardiopulmonary status limiting activity;Impaired balance (sitting and/or standing)      OT Treatment/Interventions:      OT Goals(Current goals can be found in the care plan section) Acute Rehab OT Goals Patient Stated Goal: return strength OT Goal Formulation: With patient Time For Goal Achievement: 03/07/20 Potential to Achieve Goals: Good  OT Frequency:     Barriers to D/C:            Co-evaluation              AM-PAC OT "6 Clicks" Daily Activity     Outcome Measure Help from another person eating meals?: None Help from another person taking care of personal grooming?: None Help from another person toileting, which includes using toliet, bedpan, or urinal?: A Little Help from another person bathing (including washing, rinsing, drying)?: A Little Help from another person to put on and taking off regular upper body clothing?: None Help from another person to put on and taking off regular lower body clothing?: A Little 6 Click Score: 21   End of Session Nurse Communication: Mobility status;Other (comment) (walking O2 sats)  Activity Tolerance: Patient tolerated treatment well Patient left: in bed;with call bell/phone within reach  OT Visit Diagnosis: Unsteadiness on feet (R26.81);Other abnormalities of gait and mobility (R26.89)                Time: 1761-6073 OT Time Calculation (min): 29 min Charges:  OT General Charges $OT Visit: 1 Visit OT Evaluation $OT Eval Moderate Complexity: 1 Mod OT Treatments $Self Care/Home Management : 8-22 mins  Dalphine Handing, MSOT, OTR/L Acute Rehabilitation Services [facility] Office Number: [phone] Pager: [phone]  Dalphine Handing 02/22/2020, 2:01 PM

## 2020-02-22 NOTE — Discharge Summary (Signed)
Name: Sonya Clark MRN: [id number] DOB: 12/10/1965 54 y.o. PCP: Patient, No Pcp Per  Date of Admission: 02/17/2020  8:40 AM Date of Discharge: 02/22/2020 Attending Physician: Tyson Alias, *  Discharge Diagnosis: 1. COVID-19 2. Type II DM  Discharge Medications: Allergies as of 02/22/2020   No Known Allergies     Medication List    TAKE these medications   acetaminophen 500 MG tablet Commonly known as: TYLENOL Take 500 mg by mouth every 6 (six) hours as needed for mild pain.   lisinopril-hydrochlorothiazide 20-25 MG tablet Commonly known as: ZESTORETIC Take 1 tablet by mouth daily.   metFORMIN 500 MG tablet Commonly known as: Glucophage Take 1 tablet (500 mg total) by mouth daily with breakfast.       Disposition and follow-up:   Ms.Sonya Clark was discharged from [facility] in Stable condition.  At the hospital follow up visit please address:  1.  Type II DM: Patient was newly diagnosed with type 2 diabetes, hemoglobin A1c of 7.9.  Treated with insulin during hospital stay discharged on Metformin 500 mg once daily. Please adjust accordingly.   Post covid-19: Patient was treated with remdesivir, dexamethasone and baricitinib.  She was placed on supplemental oxygen during her hospital stay but did not require to be discharged with home oxygen.  Please reassess oxygen saturations.  2.  Labs / imaging needed at time of follow-up: none  3.  Pending labs/ test needing follow-up: none  Follow-up Appointments:  Follow-up Information    [facility] Follow up.   Why: appointment to obtain Primary care Physican. Post COVID clinic appointment. THere is pharmacy, finacial counselling, and pimary care. Contact information: [address] [place] [address] [phone]       [facility]. Schedule an appointment as soon as possible for a visit on 03/05/2020.   Why: 0845  am Contact information: [address] [place] [address] [phone]              Hospital Course by problem list: 1.  COVID-19: Patient was hospitalized for 6 days secondary to COVID-19.  Treated with 5 days of remdesivir, baricitinib and steroids.  Was requiring supplemental oxygen during hospital course.  Did not require to be discharged home with supplemental oxygen.  Patient is not vaccinated.  2.  Hyperglycemia: patient had continuous elevated blood sugar levels during hospital course found to be diabetic with a hemoglobin A1c of 7.9.  She was treated with 10 units of Lantus at bedtime and 8 units of NovoLog 3 times daily with meals.  She was discharged home on 500 mg Metformin daily.  She felt reluctant to start on a higher dose due to side effects.  Discharge Vitals:   BP 114/63 (BP Location: Left Arm)   Pulse 86   Temp 98.4 F (36.9 C) (Axillary)   Resp 20   Ht 5\' 4"  (1.626 m)   Wt 103.1 kg   SpO2 99%   BMI 39.01 kg/m   Pertinent Labs, Studies, and Procedures:  CBC Latest Ref Rng & Units 02/22/2020 02/21/2020 02/20/2020  WBC 4.0 - 10.5 K/uL 10.0 10.2 11.7(H)  Hemoglobin 12.0 - 15.0 g/dL 02/22/2020 14.4 31.5  Hematocrit 36 - 46 % 41.5 41.3 41.2  Platelets 150 - 400 K/uL 582(H) 613(H) 616(H)   CMP Latest Ref Rng & Units 02/22/2020 02/21/2020 02/20/2020  Glucose 70 - 99 mg/dL 02/22/2020) 867(Y) 195(K)  BUN 6 - 20  mg/dL 91(Q) 94(H) 03(U)  Creatinine 0.44 - 1.00 mg/dL 8.82 8.00 3.49(Z)  Sodium 135 - 145 mmol/L 133(L) 139 136  Potassium 3.5 - 5.1 mmol/L 3.9 3.8 4.3  Chloride 98 - 111 mmol/L 98 103 99  CO2 22 - 32 mmol/L 26 27 26   Calcium 8.9 - 10.3 mg/dL ) 9.2 9.1  Total Protein 6.5 - 8.1 g/dL 6.4(L) 6.5 6.9  Total Bilirubin 0.3 - 1.2 mg/dL 0.4 0.3 0.6  Alkaline Phos 38 - 126 U/L 62 67 73  AST 15 - 41 U/L 20 20 21   ALT 0 - 44 U/L 24 26 28     Iron/TIBC/Ferritin/ %Sat    Component Value Date/Time   IRON 37 02/19/2020 0615   TIBC 140 (L) 02/19/2020  0615   FERRITIN 110 02/22/2020 0500   IRONPCTSAT 26 02/19/2020 0615   C-Reactive Protein     Component Value Date/Time   CRP 1.4 (H) 02/22/2020 0500     Discharge Instructions:  Ms. Sonya Clark were hospitalized due to COVID-19. You were treated with the best medications we currently have for COVID-19 and steroids. You also required supplemental oxygen while you were here but will not need to continue using this at home.  While you were here we also diagnosed you with Type II Diabetes. You were treated with insulin while you were here. I have prescribed you metformin 500 mg to start taking once a day. It is important to start this medication to help control your high blood sugars.  Please call [facility] on Monday to schedule a follow up appointment for a post covid appointment as well as to follow up for your new diagnosis of type II diabetes.  I am glad to see you are doing so much better and hoping for a strong recovery.  Thank you for allowing Sonya Clark to be a part of your care!  Discharge Instructions    Call MD for:  difficulty breathing, headache or visual disturbances   Complete by: As directed    Call MD for:  persistant dizziness or light-headedness   Complete by: As directed    Call MD for:  persistant nausea and vomiting   Complete by: As directed    Call MD for:  temperature >100.4   Complete by: As directed    Diet Carb Modified   Complete by: As directed    Increase activity slowly   Complete by: As directed       Signed: Sierah Lacewell N, DO 02/22/2020, 2:49 PM

## 2020-02-22 NOTE — Plan of Care (Signed)
  Problem: Activity: Goal: Ability to tolerate increased activity will improve Outcome: Adequate for Discharge   Problem: Clinical Measurements: Goal: Ability to maintain a body temperature in the normal range will improve Outcome: Adequate for Discharge   Problem: Respiratory: Goal: Ability to maintain adequate ventilation will improve Outcome: Adequate for Discharge Goal: Ability to maintain a clear airway will improve Outcome: Adequate for Discharge   Problem: Education: Goal: Knowledge of General Education information will improve Description: Including pain rating scale, medication(s)/side effects and non-pharmacologic comfort measures 02/22/2020 1848 by Luellen Pucker, RN Outcome: Adequate for Discharge 02/22/2020 1009 by Luellen Pucker, RN Outcome: Progressing   Problem: Health Behavior/Discharge Planning: Goal: Ability to manage health-related needs will improve 02/22/2020 1848 by Luellen Pucker, RN Outcome: Adequate for Discharge 02/22/2020 1009 by Luellen Pucker, RN Outcome: Progressing   Problem: Clinical Measurements: Goal: Ability to maintain clinical measurements within normal limits will improve Outcome: Adequate for Discharge Goal: Will remain free from infection Outcome: Adequate for Discharge Goal: Diagnostic test results will improve Outcome: Adequate for Discharge Goal: Respiratory complications will improve Outcome: Adequate for Discharge Goal: Cardiovascular complication will be avoided Outcome: Adequate for Discharge   Problem: Activity: Goal: Risk for activity intolerance will decrease Outcome: Adequate for Discharge   Problem: Nutrition: Goal: Adequate nutrition will be maintained 02/22/2020 1848 by Luellen Pucker, RN Outcome: Adequate for Discharge 02/22/2020 1009 by Luellen Pucker, RN Outcome: Progressing   Problem: Coping: Goal: Level of anxiety will decrease Outcome: Adequate for Discharge   Problem: Elimination: Goal: Will not  experience complications related to bowel motility Outcome: Adequate for Discharge Goal: Will not experience complications related to urinary retention Outcome: Adequate for Discharge   Problem: Pain Managment: Goal: General experience of comfort will improve Outcome: Adequate for Discharge   Problem: Safety: Goal: Ability to remain free from injury will improve Outcome: Adequate for Discharge   Problem: Skin Integrity: Goal: Risk for impaired skin integrity will decrease Outcome: Adequate for Discharge   Problem: Education: Goal: Knowledge of risk factors and measures for prevention of condition will improve Outcome: Adequate for Discharge   Problem: Coping: Goal: Psychosocial and spiritual needs will be supported Outcome: Adequate for Discharge   Problem: Respiratory: Goal: Will maintain a patent airway Outcome: Adequate for Discharge Goal: Complications related to the disease process, condition or treatment will be avoided or minimized Outcome: Adequate for Discharge

## 2020-02-22 NOTE — TOC Transition Note (Signed)
Transition of Care [facility]) - CM/SW Discharge Note   Patient Details  Name: Sonya Clark MRN: [id number] Date of Birth: 09-Apr-1966  Transition of Care [facility]) CM/SW Contact:  Lockie Pares, RN Phone Number: 02/22/2020, 2:33 PM   Clinical Narrative:    Patient is discharging today. No oxygen requirement. MATCH letter done and medications are being filled in [facility] pharmacy. No other needs identified.    Final next level of care: Home/Self Care Barriers to Discharge: No Barriers Identified   Patient Goals and CMS Choice        Discharge Placement                       Discharge Plan and Services   Discharge Planning Services: [facility]                                 Social Determinants of Health (SDOH) Interventions     Readmission Risk Interventions No flowsheet data found.

## 2020-02-22 NOTE — Progress Notes (Signed)
   Subjective: Patient reports feeling better today. States she was unable to work with PT yesterday due to feeling fatigued and SOB. Denies any new symptoms, no chest pain, abdominal pain or diarrhea.   Objective:  Vital signs in last 24 hours: Vitals:   02/21/20 1454 02/21/20 2030 02/21/20 2100 02/22/20 0527  BP: 113/77  132/70 120/71  Pulse: 75 70 71 76  Resp: 20 19 20 20   Temp: (!) 97.5 F (36.4 C)  97.6 F (36.4 C) 98.1 F (36.7 C)  TempSrc: Oral  Oral Oral  SpO2:  96% 98% 96%  Weight:    103.1 kg  Height:        Supplemental Oxygen: 96% on 5L [place]  Constitution: NAD, seen resting on room air comfortably, appears stated age Cardio: RRR, no m/r/g, no LE edema  Respiratory: CTA, no w/r/r Abdominal: NTTP, soft, non-distended MSK: moving all extremities Neuro: normal affect, a&ox3 Skin: c/d/i   Assessment/Plan:  Active Problems:   COVID-19 virus infection  54 year old female with known history of hypertension presenting to the ED with worsening shortness of breath found to be positive for COVID-19.  COVID-19 Hospital day6.Seen resting comfortably on room air. Saturating at 96% on 5L [employer].Inflammatory markers continue to trend downward. Will check saturations on ambulation, possible discharge home today if patient's saturations remain stable.   - tussionex q12h prn, robitussen q4h prn - remdesivir day5/5 -dexamethasone day 6 of steroids  - baricitinib day5 - IS, flutter valve - PT - will need covid vaccine after discharge  Hyperglycemia Blood glucose 148. -Levemir 10 unitsqhs - Insulin aspart 8 units TID WC - SSI -Hgb A1c 7.9, will need to follow up with PCP   53 [facility]:ENIDPOE Diet:Heart Code:Full Code  Dispo: Anticipated discharge later today if patient's saturations remain stable on ambulation.  Skyla Champagne N, DO 02/22/2020, 7:34 AM Pager: [phone] After 5pm on weekdays and 1pm on weekends: On Call Pager:  [phone]

## 2020-02-22 NOTE — Progress Notes (Signed)
PT Cancellation Note  Patient Details Name: Sonya Clark MRN: [id number] DOB: 1965-08-22   Cancelled Treatment:    Reason Eval/Treat Not Completed: Patient declined, no reason specified. Pt reports no interest in working with PT before d/c this afternoon.  Ina Homes, PT, DPT Acute Rehabilitation Services  Pager [phone] Office [phone]  Malachy Chamber 02/22/2020, 4:27 PM

## 2020-02-22 NOTE — Progress Notes (Addendum)
Inpatient Diabetes Program Recommendations  AACE/ADA: New Consensus Statement on Inpatient Glycemic Control (2015)  Target Ranges:  Prepandial:   less than 140 mg/dL      Peak postprandial:   less than 180 mg/dL (1-2 hours)      Critically ill patients:  140 - 180 mg/dL   Lab Results  Component Value Date   GLUCAP 193 (H) 02/22/2020   HGBA1C 7.9 (H) 02/19/2020   New diagnosis of DM  Inpatient Diabetes Program Recommendations:    Note plans for patient to d/c home today.  Spoke with patient by phone regarding new diagnosis of DM.  Discussed A1C results with her and explained what an A1C is, basic pathophysiology of DM Type 2, basic home care, importance of checking CBGs and maintaining good CBG control to prevent long-term and short-term complications.  Reviewed signs and symptoms of hyperglycemia and hypoglycemia. Have ordered educational booklet. At this point patient is only going home on oral DM agents. Told her about [employer] glucose meter that is 10$ fr monitoring if needed.   Thanks, Beryl Meager, RN, BC-ADM Inpatient Diabetes Coordinator Pager [phone] (8a-5p)

## 2020-03-05 ENCOUNTER — Inpatient Hospital Stay: Payer: No Typology Code available for payment source

## 2020-03-05 ENCOUNTER — Other Ambulatory Visit: Payer: Self-pay

## 2020-03-05 ENCOUNTER — Ambulatory Visit (INDEPENDENT_AMBULATORY_CARE_PROVIDER_SITE_OTHER): Payer: HRSA Program | Admitting: Nurse Practitioner

## 2020-03-05 DIAGNOSIS — U071 COVID-19: Secondary | ICD-10-CM | POA: Diagnosis not present

## 2020-03-05 MED ORDER — ALBUTEROL SULFATE (2.5 MG/3ML) 0.083% IN NEBU: 2.5000 mg | INHALATION_SOLUTION | Freq: Four times a day (QID) | RESPIRATORY_TRACT | 1 refills | Status: AC | PRN

## 2020-03-05 NOTE — Assessment & Plan Note (Signed)
Will order albuterol  Stay well hydrated  Stay active  Deep breathing exercises  May start vitamin C 2,000 mg daily, vitamin D3 2,000 IU daily, Zinc 220 mg daily, and Quercetin 500 mg twice daily  May take tylenol or fever or pain  May take mucinex twice daily    Diabetes:  Will need to establish care with PCP   Follow up:  Follow up as needed

## 2020-03-05 NOTE — Progress Notes (Signed)
@Patient  ID: , female    DOB: 1965/09/27, 54 y.o.   MRN: [id number]  Chief Complaint  Patient presents with  . Hospitalization Follow-up    covid     Referring provider: No ref. provider found   54 year old female with hypertension, OSA, GERD, hyperlipidemia.  Diagnosed with Covid in September 2021.  HPI  Patient presents today for post COVID care clinic visit.  Patient was hospitalized from 02/17/2020 through 02/22/2020 with Covid pneumonia and newly diagnosed type 2 diabetes patient was treated with remdesivir, dexamethasone, baricitinib.  She did not require oxygen at hospital discharge.  Patient states that she is much improved.  She does still have some occasional shortness of breath with exertion and body aches.  She does have albuterol inhaler but does need a refill today.  Patient is aware that she does need to establish care with a PCP to follow-up on diabetes.  A list of current PCPs within our system however currently accepting patients was given to her during the visit today.  Patient states that she is checking her blood sugars at home and they have been running around 100. Denies f/c/s, n/v/d, hemoptysis, PND, chest pain or edema.        No Known Allergies   There is no immunization history on file for this patient.  Past Medical History:  Diagnosis Date  . Hypertension     Tobacco History: Social History   Tobacco Use  Smoking Status Never Smoker  Smokeless Tobacco Never Used   Counseling given: Not Answered   Outpatient Encounter Medications as of 03/05/2020  Medication Sig  . acetaminophen (TYLENOL) 500 MG tablet Take 500 mg by mouth every 6 (six) hours as needed for mild pain.  03/07/2020 lisinopril-hydrochlorothiazide (ZESTORETIC) 20-25 MG tablet Take 1 tablet by mouth daily.  . metFORMIN (GLUCOPHAGE) 500 MG tablet Take 1 tablet (500 mg total) by mouth daily with breakfast.  . albuterol (PROVENTIL) (2.5 MG/3ML) 0.083% nebulizer solution Take 3 mLs (2.5  mg total) by nebulization every 6 (six) hours as needed for wheezing or shortness of breath.   No facility-administered encounter medications on file as of 03/05/2020.     Review of Systems  Review of Systems  Constitutional: Negative.  Negative for fatigue and fever.  HENT: Negative.   Respiratory: Positive for shortness of breath. Negative for cough.   Cardiovascular: Negative.  Negative for chest pain, palpitations and leg swelling.  Gastrointestinal: Negative.   Allergic/Immunologic: Negative.   Neurological: Negative.   Psychiatric/Behavioral: Negative.        Physical Exam  BP 132/82   Pulse 80   Temp 97.7 F (36.5 C) (Tympanic)   Resp 16   Ht 5\' 4"  (1.626 m)   Wt 224 lb 12.8 oz (102 kg)   SpO2 98%   BMI 38.59 kg/m   Wt Readings from Last 5 Encounters:  03/05/20 224 lb 12.8 oz (102 kg)  02/22/20 227 lb 4.7 oz (103.1 kg)  01/03/20 (!) 234 lb 12.8 oz (106.5 kg)     Physical Exam Vitals and nursing note reviewed.  Constitutional:      General: She is not in acute distress.    Appearance: She is well-developed.  Cardiovascular:     Rate and Rhythm: Normal rate and regular rhythm.  Pulmonary:     Effort: Pulmonary effort is normal.     Breath sounds: Normal breath sounds.  Musculoskeletal:     Right lower leg: No edema.     Left lower  leg: No edema.  Neurological:     Mental Status: She is alert and oriented to person, place, and time.  Psychiatric:        Mood and Affect: Mood normal.        Behavior: Behavior normal.      Imaging: DG Chest Portable 1 View  Result Date: 02/17/2020 CLINICAL DATA:  Shortness of breath, diarrhea, headache, and cough for 3 days, received first dose of Pfizer vaccine 3 weeks ago EXAM: PORTABLE CHEST 1 VIEW COMPARISON:  Portable exam 0924 hours without priors for comparison FINDINGS: Upper normal heart size. Normal mediastinal contours and pulmonary vascularity. Questionable hazy RIGHT upper lobe infiltrate. Remaining  lungs grossly clear. No pleural effusion or pneumothorax. IMPRESSION: Questionable RIGHT upper lobe pneumonia. Electronically Signed   By: Ulyses Southward M.D.   On: 02/17/2020 09:59     Assessment & Plan:   COVID-19 virus infection Will order albuterol  Stay well hydrated  Stay active  Deep breathing exercises  May start vitamin C 2,000 mg daily, vitamin D3 2,000 IU daily, Zinc 220 mg daily, and Quercetin 500 mg twice daily  May take tylenol or fever or pain  May take mucinex twice daily    Diabetes:  Will need to establish care with PCP   Follow up:  Follow up as needed      Ivonne Andrew, NP 03/05/2020

## 2020-03-05 NOTE — Patient Instructions (Addendum)
Covid 19:  Will order albuterol  Stay well hydrated  Stay active  Deep breathing exercises  May start vitamin C 2,000 mg daily, vitamin D3 2,000 IU daily, Zinc 220 mg daily, and Quercetin 500 mg twice daily  May take tylenol or fever or pain  May take mucinex twice daily    Diabetes:  Will need to establish care with PCP   Follow up:  Follow up as needed

## 2021-12-27 IMAGING — DX DG CHEST 1V PORT
1 series · 1 of 1 positions shown · non-contrast
Comparison: Portable exam 1015 hours without priors for comparison

CLINICAL DATA: Shortness of breath, diarrhea, headache, and cough
for 3 days, received first dose of [HOSPITAL] vaccine 3 weeks ago

EXAM:
PORTABLE CHEST 1 VIEW

[chest ap]
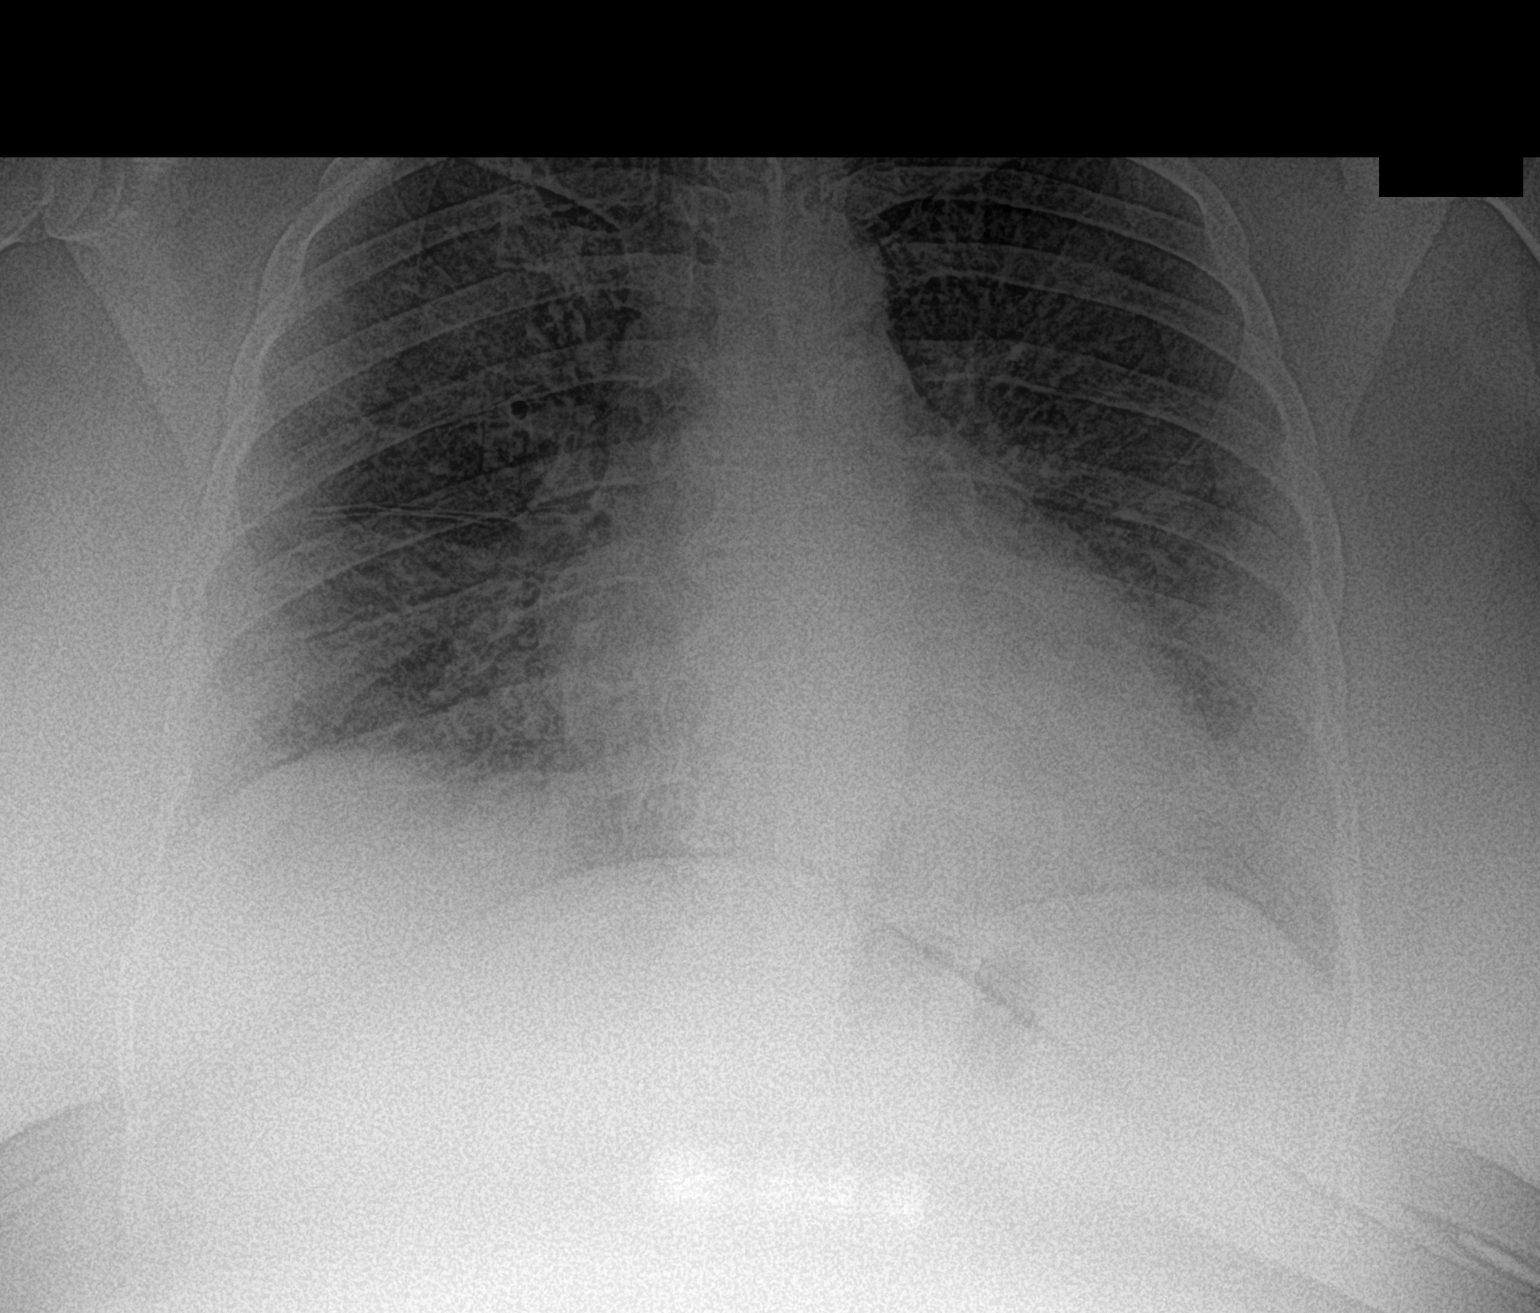

[1 of 1 positions shown; findings below may reference images not displayed]

FINDINGS: Upper normal heart size.

Normal mediastinal contours and pulmonary vascularity.

Questionable hazy RIGHT upper lobe infiltrate.

Remaining lungs grossly clear.

No pleural effusion or pneumothorax.
IMPRESSION: Questionable RIGHT upper lobe pneumonia.

## 2022-05-14 DIAGNOSIS — M75112 Incomplete rotator cuff tear or rupture of left shoulder, not specified as traumatic: Secondary | ICD-10-CM | POA: Diagnosis not present

## 2022-05-14 DIAGNOSIS — Z789 Other specified health status: Secondary | ICD-10-CM | POA: Diagnosis not present

## 2022-08-11 ENCOUNTER — Telehealth: Payer: Self-pay

## 2022-08-11 NOTE — Telephone Encounter (Signed)
Mychart msg sent
